# Patient Record
Sex: Male | Born: 1970 | Hispanic: No | Marital: Married | Smoking: Never smoker
Health system: Southern US, Community
[De-identification: ages and names within clinical notes are randomized; demographics above are authoritative.]

## PROBLEM LIST (undated history)

## (undated) DIAGNOSIS — E669 Obesity, unspecified: Secondary | ICD-10-CM

## (undated) DIAGNOSIS — E785 Hyperlipidemia, unspecified: Secondary | ICD-10-CM

## (undated) DIAGNOSIS — A048 Other specified bacterial intestinal infections: Secondary | ICD-10-CM

## (undated) DIAGNOSIS — R7309 Other abnormal glucose: Secondary | ICD-10-CM

## (undated) DIAGNOSIS — K219 Gastro-esophageal reflux disease without esophagitis: Secondary | ICD-10-CM

## (undated) DIAGNOSIS — K602 Anal fissure, unspecified: Secondary | ICD-10-CM

## (undated) DIAGNOSIS — I10 Essential (primary) hypertension: Secondary | ICD-10-CM

## (undated) HISTORY — PX: MENISCUS REPAIR: SHX5179

## (undated) HISTORY — DX: Other abnormal glucose: R73.09

## (undated) HISTORY — DX: Anal fissure, unspecified: K60.2

## (undated) HISTORY — DX: Gastro-esophageal reflux disease without esophagitis: K21.9

## (undated) HISTORY — DX: Obesity, unspecified: E66.9

## (undated) HISTORY — DX: Hyperlipidemia, unspecified: E78.5

## (undated) HISTORY — DX: Other specified bacterial intestinal infections: A04.8

---

## 2003-01-05 ENCOUNTER — Emergency Department (HOSPITAL_COMMUNITY): Admission: EM | Admit: 2003-01-05 | Discharge: 2003-01-05 | Payer: Self-pay | Admitting: Emergency Medicine

## 2010-07-28 ENCOUNTER — Ambulatory Visit (HOSPITAL_BASED_OUTPATIENT_CLINIC_OR_DEPARTMENT_OTHER): Admission: RE | Admit: 2010-07-28 | Discharge: 2010-07-28 | Payer: Self-pay | Admitting: Specialist

## 2011-01-29 LAB — POCT HEMOGLOBIN-HEMACUE: Hemoglobin: 13.4 g/dL (ref 13.0–17.0)

## 2012-07-20 ENCOUNTER — Emergency Department (HOSPITAL_COMMUNITY)
Admission: EM | Admit: 2012-07-20 | Discharge: 2012-07-20 | Disposition: A | Discharge status: Home / Self Care | Payer: Self-pay | Attending: Emergency Medicine | Admitting: Emergency Medicine

## 2012-07-20 ENCOUNTER — Encounter (HOSPITAL_COMMUNITY): Payer: Self-pay | Admitting: *Deleted

## 2012-07-20 ENCOUNTER — Emergency Department (HOSPITAL_COMMUNITY): Payer: Self-pay

## 2012-07-20 DIAGNOSIS — Z79899 Other long term (current) drug therapy: Secondary | ICD-10-CM | POA: Insufficient documentation

## 2012-07-20 DIAGNOSIS — R079 Chest pain, unspecified: Secondary | ICD-10-CM | POA: Insufficient documentation

## 2012-07-20 DIAGNOSIS — R42 Dizziness and giddiness: Secondary | ICD-10-CM | POA: Insufficient documentation

## 2012-07-20 DIAGNOSIS — K219 Gastro-esophageal reflux disease without esophagitis: Secondary | ICD-10-CM

## 2012-07-20 DIAGNOSIS — I10 Essential (primary) hypertension: Secondary | ICD-10-CM | POA: Insufficient documentation

## 2012-07-20 HISTORY — DX: Essential (primary) hypertension: I10

## 2012-07-20 LAB — CBC
HCT: 43.1 % (ref 39.0–52.0)
Hemoglobin: 14.3 g/dL (ref 13.0–17.0)
MCV: 81 fL (ref 78.0–100.0)
RBC: 5.32 MIL/uL (ref 4.22–5.81)
WBC: 12.4 10*3/uL — ABNORMAL HIGH (ref 4.0–10.5)

## 2012-07-20 LAB — BASIC METABOLIC PANEL
BUN: 15 mg/dL (ref 6–23)
CO2: 26 mEq/L (ref 19–32)
Chloride: 95 mEq/L — ABNORMAL LOW (ref 96–112)
Creatinine, Ser: 1.16 mg/dL (ref 0.50–1.35)
Glucose, Bld: 110 mg/dL — ABNORMAL HIGH (ref 70–99)
Potassium: 4.1 mEq/L (ref 3.5–5.1)

## 2012-07-20 MED ORDER — GI COCKTAIL ~~LOC~~
30.0000 mL | Freq: Once | ORAL | Status: AC
  Administered 2012-07-20: 30 mL via ORAL
  Filled 2012-07-20: qty 30

## 2012-07-20 MED ORDER — RANITIDINE HCL 150 MG PO CAPS: 150.0000 mg | ORAL_CAPSULE | Freq: Every day | ORAL | Status: DC

## 2012-07-20 NOTE — ED Notes (Signed)
Prescription given with discharge instructions. Pt alert and oriented x4.

## 2012-07-20 NOTE — ED Notes (Signed)
Pt. States he had recently stopped taking his BP medication (Lisinopril) and just began taking it x3 days ago. States left chest pain x3 days with dizziness and numbness in legs. Currently denies SOB, dizziness, numbness/tingling. Chest pressure 3/10.

## 2012-07-20 NOTE — ED Notes (Signed)
Patient complains of burning in chest with resolved dizziness on arrival. Here for evaluations of chest pain and BP medications

## 2012-07-20 NOTE — ED Provider Notes (Signed)
History     CSN: [id number]  Arrival date & time 07/20/12  1851   First MD Initiated Contact with Patient 07/20/12 1921      Chief Complaint  Patient presents with  . Chest Pain  . Dizziness    (Consider location/radiation/quality/duration/timing/severity/associated sxs/prior treatment) HPIMohamed A Snyder is a 41 y.o. male the history of hypertension only presents with 3 days history of chest pain. Patient says he's been out of his hypertension medicine for months, and over the course the last 3 days has had chest pain with 2 different characters. Patient says he has had some sharp stabbing pain some to the left of sternum and some underneath his left breast, and he also has a heaviness in the chest which centered over the left side. The pain does not radiate, he said he did have some occasional dizziness. The dizziness was not linked temporally with chest pain. Patient said he had symptoms were worsened after eating tonight (fried fish.) Is had no nausea or vomiting, no diaphoresis, no syncope, no fever or chills, productive cough or sick contacts.  Past Medical History  Diagnosis Date  . Hypertension     History reviewed. No pertinent past surgical history.  History reviewed. No pertinent family history.  History  Substance Use Topics  . Smoking status: Never Smoker   . Smokeless tobacco: Not on file  . Alcohol Use: No      Review of Systems positive for chest pain, dizziness; Patient denies any fevers or chills, changes in vision, earache, sore throat, neck pain or stiffness, palpitations, syncope, dyspnea, cough, wheezing, abdominal pain, nausea, vomiting, diarrhea, melena, red bloody stools, frequency, dysuria, myalgias, arthralgias, back pain, recent trauma, rash, itching, skin lesions, easy bruising or bleeding, headache, seizures, numbness, tingling or weakness and denies depression, and anxiety.    Allergies  Review of patient's allergies indicates no known  allergies.  Home Medications   Current Outpatient Rx  Name Route Sig Dispense Refill  . LISINOPRIL-HYDROCHLOROTHIAZIDE 20-25 MG PO TABS Oral Take 1 tablet by mouth daily.      BP 139/86  Pulse 90  Temp 98.4 F (36.9 C) (Oral)  SpO2 99%  Physical Exam VITAL SIGNS:   Filed Vitals:   07/20/12 1904  BP: 139/86  Pulse: 90  Temp: 98.4 F (36.9 C)   CONSTITUTIONAL: Awake, oriented, appears non-toxic HENT: Atraumatic, normocephalic, oral mucosa pink and moist, airway patent. Nares patent without drainage. External ears normal. EYES: Conjunctiva clear, EOMI, PERRLA NECK: Trachea midline, non-tender, supple CARDIOVASCULAR: Normal heart rate, Normal rhythm, No murmurs, rubs, gallops PULMONARY/CHEST: Clear to auscultation, no rhonchi, wheezes, or rales. Symmetrical breath sounds. Non-tender. ABDOMINAL: Obese, Non-distended, soft, non-tender, no rebound or guarding.  BS normal. NEUROLOGIC: Non-focal, moving all four extremities, no gross sensory or motor deficits. EXTREMITIES: No clubbing, cyanosis, or edema SKIN: Warm, Dry, No erythema, No rash  ED Course  Procedures (including critical care time)  Labs Reviewed  CBC - Abnormal; Notable for the following:    WBC 12.4 (*)     Platelets 463 (*)     All other components within normal limits  BASIC METABOLIC PANEL - Abnormal; Notable for the following:    Sodium 134 (*)     Chloride 95 (*)     Glucose, Bld 110 (*)     GFR calc non Af Amer 76 (*)     GFR calc Af Amer 88 (*)     All other components within normal limits  TROPONIN I  LAB REPORT - SCANNED   No results found.   No diagnosis found.   Medications  lisinopril-hydrochlorothiazide (PRINZIDE,ZESTORETIC) 20-25 MG per tablet (not administered)  ranitidine (ZANTAC) 150 MG capsule (not administered)  gi cocktail (Maalox,Lidocaine,Donnatal) (30 mL Oral Given 07/20/12 1955)     MDM  Ryan Snyder is a 41 y.o. male with no family history of coronary artery  disease, no personal history of coronary artery disease annually risk factor he has is hypertension.  Patient's symptoms are linked to eating - and I suspect he likely has some element of GERD.  Pt given GI cocktail with resolution of pain.  Troponin and ECG negative - with 3 days of pain, any cardiac damage would yield positive troponin.  Pain is not exertional.  Pt will be started on Zantac or OTC equivalent, can use TUMS/ Mylanta as needed.  Follow up with PCP.  I explained the diagnosis and have given explicit precautions to return to the ER including exertional chest pain,shortness of breathor any other new or worsening symptoms. The patient understands and accepts the medical plan as it's been dictated and I have answered their questions. Discharge instructions concerning home care and prescriptions have been given.  The patient is STABLE and is discharged to home in good condition.        Jones Skene, MD 07/21/12 1314

## 2012-07-20 NOTE — ED Notes (Signed)
Pt in xray

## 2012-11-16 HISTORY — PX: ESOPHAGOGASTRODUODENOSCOPY: SHX1529

## 2013-04-16 DIAGNOSIS — A048 Other specified bacterial intestinal infections: Secondary | ICD-10-CM

## 2013-04-16 HISTORY — DX: Other specified bacterial intestinal infections: A04.8

## 2013-04-25 ENCOUNTER — Emergency Department (HOSPITAL_COMMUNITY)
Admission: EM | Admit: 2013-04-25 | Discharge: 2013-04-25 | Disposition: A | Discharge status: Home / Self Care | Payer: Medicaid Other | Attending: Emergency Medicine | Admitting: Emergency Medicine

## 2013-04-25 ENCOUNTER — Encounter (HOSPITAL_COMMUNITY): Payer: Self-pay

## 2013-04-25 DIAGNOSIS — Z792 Long term (current) use of antibiotics: Secondary | ICD-10-CM | POA: Insufficient documentation

## 2013-04-25 DIAGNOSIS — G629 Polyneuropathy, unspecified: Secondary | ICD-10-CM

## 2013-04-25 DIAGNOSIS — I10 Essential (primary) hypertension: Secondary | ICD-10-CM | POA: Insufficient documentation

## 2013-04-25 DIAGNOSIS — Z79899 Other long term (current) drug therapy: Secondary | ICD-10-CM | POA: Insufficient documentation

## 2013-04-25 DIAGNOSIS — A048 Other specified bacterial intestinal infections: Secondary | ICD-10-CM | POA: Insufficient documentation

## 2013-04-25 DIAGNOSIS — H9312 Tinnitus, left ear: Secondary | ICD-10-CM

## 2013-04-25 DIAGNOSIS — H9319 Tinnitus, unspecified ear: Secondary | ICD-10-CM | POA: Insufficient documentation

## 2013-04-25 DIAGNOSIS — R509 Fever, unspecified: Secondary | ICD-10-CM | POA: Insufficient documentation

## 2013-04-25 DIAGNOSIS — G589 Mononeuropathy, unspecified: Secondary | ICD-10-CM | POA: Insufficient documentation

## 2013-04-25 DIAGNOSIS — R42 Dizziness and giddiness: Secondary | ICD-10-CM | POA: Insufficient documentation

## 2013-04-25 NOTE — ED Provider Notes (Signed)
History    This chart was scribed for non-physician practitioner Raymon Mutton PA-C working with Hurman Horn, MD by Smitty Pluck, ED scribe. This patient was seen in room WTR8/WTR8 and the patient's care was started at 3:17 PM.   CSN: [id number]  Arrival date & time 04/25/13  1412   None     Chief Complaint  Patient presents with  . Otalgia  . Tinnitus    The history is provided by the patient and medical records. No language interpreter was used.   HPI Comments: Ryan Snyder is a 42 y.o. male with hx of HTN who presents to the Emergency Department complaining of constant, moderate tinnitus in left ear onset 8 days ago. He states he was seen by PCP and had left ear irrigated with water but the ringing sensation in left ear remained. Pt states he was told to use Debrox but did not use it. He reports having pressure on scalp above ear onset 7 days ago and he has had dizziness that started 3 days ago. Pt states he is able to ambulate without complication but sometimes when he stands the dizziness occurs. Pt states he was diagnosed with H. Pylori 8 days ago by his PCP at [facility]. He reports having subjective fever 3 days ago (current temperature is 98.6). He mentions he had burning sensation in bilateral feet and hands that occurred once last week and once yesterday. He states the episodes of burning sensation lasted throughout the night (4-6 hours).  He denies loss of hearing, trouble hearing, trauma to left ear, loud noise exposure, swimming, drainage from ear, difficulty swallowing, chills, nausea, nasal congestion, vomiting, diarrhea, weakness, cough, SOB and any other pain. He states he has taken his HTN medication as instructed. He currently takes amoxicillin, biaxin and azithromycin.     Past Medical History  Diagnosis Date  . Hypertension     History reviewed. No pertinent past surgical history.  No family history on file.  History  Substance Use Topics  .  Smoking status: Never Smoker   . Smokeless tobacco: Not on file  . Alcohol Use: No      Review of Systems  Constitutional: Positive for fever. Negative for chills.  HENT: Positive for tinnitus. Negative for trouble swallowing.   Eyes: Negative for visual disturbance.  Respiratory: Negative for cough and shortness of breath.   Gastrointestinal: Negative for nausea.  Musculoskeletal: Negative for gait problem.  Neurological: Positive for dizziness. Negative for weakness, numbness and headaches.  All other systems reviewed and are negative.    Allergies  Review of patient's allergies indicates no known allergies.  Home Medications   Current Outpatient Rx  Name  Route  Sig  Dispense  Refill  . amoxicillin (AMOXIL) 500 MG tablet   Oral   Take 500 mg by mouth 2 (two) times daily.         . clarithromycin (BIAXIN) 500 MG tablet   Oral   Take 500 mg by mouth 2 (two) times daily.         Marland Kitchen lisinopril-hydrochlorothiazide (PRINZIDE,ZESTORETIC) 20-25 MG per tablet   Oral   Take 1 tablet by mouth daily.           BP 153/92  Pulse 85  Temp(Src) 98.6 F (37 C) (Oral)  Resp 17  Ht 5\' 6"  (1.676 m)  Wt 291 lb (131.997 kg)  BMI 46.99 kg/m2  SpO2 100%  Physical Exam  Nursing note and vitals reviewed. Constitutional: He is  oriented to person, place, and time. He appears well-developed and well-nourished. No distress.  HENT:  Head: Normocephalic and atraumatic.  Right Ear: Hearing, tympanic membrane, external ear and ear canal normal. No lacerations. No drainage, swelling or tenderness. No foreign bodies. No mastoid tenderness. Tympanic membrane is not injected, not scarred, not perforated, not erythematous, not retracted and not bulging. No middle ear effusion.  Left Ear: Hearing, tympanic membrane, external ear and ear canal normal. No lacerations. No drainage, swelling or tenderness. No foreign bodies. No mastoid tenderness. Tympanic membrane is not injected, not scarred,  not perforated, not erythematous, not retracted and not bulging.  No middle ear effusion.  Negative facial swelling noted Negative pain upon palpation to the maxillary and frontal sinuses   Eyes: Conjunctivae and EOM are normal. Pupils are equal, round, and reactive to light. Right eye exhibits no discharge. Left eye exhibits no discharge.  Neck: Normal range of motion. Neck supple.  Negative neck stiffness Negative masses noted Negative nuchal rigidity Negative pain upon palpation to the cervical mid-spines  Cardiovascular: Normal rate, regular rhythm and normal heart sounds.   No murmur heard. Pulses:      Radial pulses are 2+ on the right side, and 2+ on the left side.       Dorsalis pedis pulses are 2+ on the right side, and 2+ on the left side.  Pulmonary/Chest: Effort normal and breath sounds normal. No respiratory distress. He has no wheezes. He has no rales.  Neurological: He is alert and oriented to person, place, and time.  Cranial nerves III-XII grossly intact Proper gait, proper balance Negative Romberg Able to walk on tippy toes and heel-to-toe with proper balance Sensation intact to upper and lower extremities, bilaterally, with differentiation to sharp and dull touch  Skin: Skin is warm and dry. He is not diaphoretic.  Psychiatric: He has a normal mood and affect. His behavior is normal. Thought content normal.    ED Course  Procedures  DIAGNOSTIC STUDIES: Oxygen Saturation is 100% on room air, normal by my interpretation.    COORDINATION OF CARE: 3:23 PM Discussed ED treatment with pt and pt agrees.      Labs Reviewed - No data to display No results found.   1. Tinnitus, left   2. Neuropathy   3. H. pylori infection       MDM  I personally performed the services described in this documentation, which was scribed in my presence. The recorded information has been reviewed and is accurate.  Patient afebrile, stable. Reporting tinnitus that has been  ongoing for the past week - negative speech difficulty, facial drooping, stroke-like symptoms noted - negative trauma to the ear. Less likely to be due to medications, has been on blood pressure medication for many years - as per patient - and new medications were started after tinnitus onset. Negative findings on ear exam. No neurological deficits noted. Intermittent episodes of burning sensation to the upper and lower extremities bilaterally - doubt stroke since bilateral symptoms - denied injury or trauma - denied loss of sensation. Discussed case with Dr. Fonnie Jarvis - reviewed patient's presentation - discussed that imaging is not needed at this time - normal neurological exam and findings on physical exam. Referred patient to ENT and neurology - discussed with patient that further imaging may be needed when follow-up with specialists. Discussed with patient to follow-up with PCP at [facility] physicians. Discussed with patient to continue to monitor symptoms and if symptoms are to worsen or change  to report back to the ED - strict return instructions given. Patient agreed to plan of care, understood, all questions answered.    Raymon Mutton, PA-C 04/26/13 1127

## 2013-04-25 NOTE — Progress Notes (Signed)
[facility] has seen patient. Patient stated he was pending Medicaid.  Already a patient at [facility]. Gave him a list of primary care resources in case he needed it before his medicaid came through.

## 2013-04-25 NOTE — ED Notes (Signed)
Pt presents with NAD- Pt c/ of left ear pain and ear ringing since last Monday- sent by PCP which cleaned has some relief but never ended.  Here for reevaluation placed on antibiotic.

## 2013-04-26 NOTE — ED Provider Notes (Signed)
Medical screening examination/treatment/procedure(s) were performed by non-physician practitioner and as supervising physician I was immediately available for consultation/collaboration.   Hurman Horn, MD 04/26/13 2147

## 2013-06-26 ENCOUNTER — Encounter: Payer: Self-pay | Admitting: Internal Medicine

## 2013-07-31 ENCOUNTER — Encounter: Payer: Self-pay | Admitting: Internal Medicine

## 2013-07-31 ENCOUNTER — Ambulatory Visit (INDEPENDENT_AMBULATORY_CARE_PROVIDER_SITE_OTHER): Payer: Medicaid Other | Admitting: Internal Medicine

## 2013-07-31 VITALS — BP 100/70 | HR 80 | Ht 65.0 in | Wt 255.1 lb

## 2013-07-31 DIAGNOSIS — R634 Abnormal weight loss: Secondary | ICD-10-CM

## 2013-07-31 DIAGNOSIS — R1012 Left upper quadrant pain: Secondary | ICD-10-CM

## 2013-07-31 DIAGNOSIS — K602 Anal fissure, unspecified: Secondary | ICD-10-CM

## 2013-07-31 DIAGNOSIS — R1013 Epigastric pain: Secondary | ICD-10-CM

## 2013-07-31 MED ORDER — AMBULATORY NON FORMULARY MEDICATION: Status: DC

## 2013-07-31 NOTE — Progress Notes (Signed)
  Subjective:    Patient ID: Ryan Snyder, male    DOB: [birth year], 42 y.o.   MRN: [id number]  HPI The patient is a pleasant, married [place] men with several GI C/o. He has been having heartburn and indigestion. Having LUQ pain also. He has been treated for H. Pylori at [facility] clinic. No help. Now on pantoprazole and is somewhat better. He has had some hard stools and rectal pain, straining to stool,  as well as rectal bleeding at times. This is new. He is also describing early satiety and unintentional weight loss over the past several months.   No Known Allergies Outpatient Prescriptions Prior to Visit  Medication Sig Dispense Refill  . lisinopril-hydrochlorothiazide (PRINZIDE,ZESTORETIC) 20-25 MG per tablet Take 1 tablet by mouth daily.       No facility-administered medications prior to visit.   Past Medical History  Diagnosis Date  . Hypertension   . GERD (gastroesophageal reflux disease)   . Obesity   . Dyslipidemia   . Positive H. pylori test 04/2013  . Elevated hemoglobin A1c    History reviewed. No pertinent past surgical history. History   Social History  . Marital Status: Married    Spouse Name: N/A    Number of Children: N/A  . Years of Education: N/A   Social History Main Topics  . Smoking status: Never Smoker   . Smokeless tobacco: Former Neurosurgeon    Types: Snuff     Comment: from [place] country  . Alcohol Use: No  . Drug Use: No  . Sexual Activity: None   Other Topics Concern  . None   Social History Narrative   Married 1 son (2010) and 1 daughter (2012)   Gaffer in IT at [employer]   FHx. Negative for GI problems   Review of Systems Diffusely + insomnia, headaches, myalgia, fatigue, dyspnea, sore throat, itching All other ROS negative    Objective:   Physical Exam General:  Obese, well-developed, well-nourished and in no acute distress Eyes:  anicteric. ENT:   Mouth and posterior pharynx free of lesions.  Neck:   supple w/o  thyromegaly or mass.  Lungs: Clear to auscultation bilaterally. Heart:  S1S2, no rubs, murmurs, gallops. Abdomen:  obese, soft, non-tender, no hepatosplenomegaly, hernia, or mass and BS+.  Rectal: Male staff in w/ me - normal anoderm, tender and stenosis/spasm, no mass, tender posterior, slight blood w/ exam  Anoscopy:  Midline posterior anal fissure - small, moderate with sentinel pile  Lymph:  no cervical or supraclavicular adenopathy. Extremities:   no edema Skin   no rash. Neuro:  A&O x 3.  Psych:  appropriate mood and  Affect.   Data Reviewed:  labs - normal CBC and CMET, lipids 06/2013 except slightly high PLTS 514 and Hgb A1C 5.8%    Assessment & Plan:  Loss of weight  Abdominal pain, epigastric   LUQ pain -   Anal fissure  1. EGD to evaluate upper GI sxs The risks and benefits as well as alternatives of endoscopic procedure(s) have been discussed and reviewed. All questions answered. The patient agrees to proceed. 2. Educated about anal fissure, add 0.125% NTG ointment tid and Benefiber (generic ok) 3. Further plans pending results of 1 and 2

## 2013-07-31 NOTE — Patient Instructions (Signed)
You have been scheduled for an endoscopy with propofol. Please follow written instructions given to you at your visit today. If you use inhalers (even only as needed), please bring them with you on the day of your procedure. Your physician has requested that you go to www.startemmi.com and enter the access code given to you at your visit today. This web site gives a general overview about your procedure. However, you should still follow specific instructions given to you by our office regarding your preparation for the procedure.  We have sent the following medications to [facility]  for you to pick up at your convenience: Nitroglycerine, please use as directed   Fiber information given today Please purchase Benefiber over the counter and take one tablespoon twice daily

## 2013-08-02 ENCOUNTER — Encounter: Payer: Self-pay | Admitting: Internal Medicine

## 2013-08-04 ENCOUNTER — Encounter: Payer: Self-pay | Admitting: Internal Medicine

## 2013-08-04 ENCOUNTER — Ambulatory Visit (AMBULATORY_SURGERY_CENTER): Payer: Medicaid Other | Admitting: Internal Medicine

## 2013-08-04 ENCOUNTER — Telehealth: Payer: Self-pay | Admitting: Oncology

## 2013-08-04 VITALS — BP 113/72 | HR 59 | Temp 96.8°F | Resp 15 | Ht 65.0 in | Wt 255.0 lb

## 2013-08-04 DIAGNOSIS — D131 Benign neoplasm of stomach: Secondary | ICD-10-CM

## 2013-08-04 DIAGNOSIS — K2990 Gastroduodenitis, unspecified, without bleeding: Secondary | ICD-10-CM

## 2013-08-04 DIAGNOSIS — K2970 Gastritis, unspecified, without bleeding: Secondary | ICD-10-CM

## 2013-08-04 DIAGNOSIS — R634 Abnormal weight loss: Secondary | ICD-10-CM

## 2013-08-04 DIAGNOSIS — K317 Polyp of stomach and duodenum: Secondary | ICD-10-CM

## 2013-08-04 MED ORDER — PANTOPRAZOLE SODIUM 40 MG PO TBEC: 40.0000 mg | DELAYED_RELEASE_TABLET | Freq: Every day | ORAL | Status: DC

## 2013-08-04 MED ORDER — SODIUM CHLORIDE 0.9 % IV SOLN: 500.0000 mL | INTRAVENOUS | Status: DC

## 2013-08-04 NOTE — Telephone Encounter (Signed)
S/W PT AND GVE NP APPT 10/09 @ 1:30 W/DR. SHADAD REFERRING DR. Earvin Hansen HILL DX- THROMBOCYTOSIS WELCOME PACKET MAILED.

## 2013-08-04 NOTE — Telephone Encounter (Signed)
C/D 08/04/13 for appt. 08/24/13

## 2013-08-04 NOTE — Progress Notes (Signed)
Patient did not experience any of the following events: a burn prior to discharge; a fall within the facility; wrong site/side/patient/procedure/implant event; or a hospital transfer or hospital admission upon discharge from the facility. (G8907) Patient did not have preoperative order for IV antibiotic SSI prophylaxis. (G8918)  

## 2013-08-04 NOTE — Progress Notes (Signed)
Called to room to assist during endoscopic procedure.  Patient ID and intended procedure confirmed with present staff. Received instructions for my participation in the procedure from the performing physician.  

## 2013-08-04 NOTE — Patient Instructions (Addendum)
There was a small polyp or growth where the esophagus and stomach meet. i took biopsies and should know what it is by middle of next week and we will call you. It is most likely inflammation from vomiting. Everything else was ok.  I appreciate the opportunity to care for you.  Iva Boop, MD, FACG   YOU HAD AN ENDOSCOPIC PROCEDURE TODAY AT THE [facility]: Refer to the procedure report that was given to you for any specific questions about what was found during the examination.  If the procedure report does not answer your questions, please call your gastroenterologist to clarify.  If you requested that your care partner not be given the details of your procedure findings, then the procedure report has been included in a sealed envelope for you to review at your convenience later.  YOU SHOULD EXPECT: Some feelings of bloating in the abdomen. Passage of more gas than usual.  Walking can help get rid of the air that was put into your GI tract during the procedure and reduce the bloating. If you had a lower endoscopy (such as a colonoscopy or flexible sigmoidoscopy) you may notice spotting of blood in your stool or on the toilet paper. If you underwent a bowel prep for your procedure, then you may not have a normal bowel movement for a few days.  DIET: Your first meal following the procedure should be a light meal and then it is ok to progress to your normal diet.  A half-sandwich or bowl of soup is an example of a good first meal.  Heavy or fried foods are harder to digest and may make you feel nauseous or bloated.  Likewise meals heavy in dairy and vegetables can cause extra gas to form and this can also increase the bloating.  Drink plenty of fluids but you should avoid alcoholic beverages for 24 hours.  ACTIVITY: Your care partner should take you home directly after the procedure.  You should plan to take it easy, moving slowly for the rest of the day.  You can resume normal activity  the day after the procedure however you should NOT DRIVE or use heavy machinery for 24 hours (because of the sedation medicines used during the test).    SYMPTOMS TO REPORT IMMEDIATELY: A gastroenterologist can be reached at any hour.  During normal business hours, 8:30 AM to 5:00 PM Monday through Friday, call [phone].  After hours and on weekends, please call the GI answering service at [phone] who will take a message and have the physician on call contact you.     Following upper endoscopy (EGD)  Vomiting of blood or coffee ground material  New chest pain or pain under the shoulder blades  Painful or persistently difficult swallowing  New shortness of breath  Fever of 100F or higher  Black, tarry-looking stools  FOLLOW UP: If any biopsies were taken you will be contacted by phone or by letter within the next 1-3 weeks.  Call your gastroenterologist if you have not heard about the biopsies in 3 weeks.  Our staff will call the home number listed on your records the next business day following your procedure to check on you and address any questions or concerns that you may have at that time regarding the information given to you following your procedure. This is a courtesy call and so if there is no answer at the home number and we have not heard from you through the emergency  physician on call, we will assume that you have returned to your regular daily activities without incident.  SIGNATURES/CONFIDENTIALITY: You and/or your care partner have signed paperwork which will be entered into your electronic medical record.  These signatures attest to the fact that that the information above on your After Visit Summary has been reviewed and is understood.  Full responsibility of the confidentiality of this discharge information lies with you and/or your care-partner.

## 2013-08-04 NOTE — Progress Notes (Signed)
Procedure ends, to recovery, report given and VSS. 

## 2013-08-04 NOTE — Op Note (Signed)
[facility] [address] [place], [address]   ENDOSCOPY PROCEDURE REPORT  PATIENT: Ryan, Snyder  MR#: [id number] BIRTHDATE: 08-07-1971 , 41  yrs. old GENDER: Male ENDOSCOPIST: Iva Boop, MD, Clementeen Graham REFERRED BY:  Renaye Rakers, M.D. PROCEDURE DATE:  08/04/2013 PROCEDURE:  EGD w/ biopsy ASA CLASS:     Class II INDICATIONS:  Weight loss.   Epigastric pain.   Vomiting. MEDICATIONS: Propofol (Diprivan) 180 mg IV, MAC sedation, administered by CRNA, and These medications were titrated to patient response per physician's verbal order TOPICAL ANESTHETIC: Cetacaine Spray  DESCRIPTION OF PROCEDURE: After the risks benefits and alternatives of the procedure were thoroughly explained, informed consent was obtained.  The LB [id number] endoscope was introduced through the mouth and advanced to the second portion of the duodenum. Without limitations.  The instrument was slowly withdrawn as the mucosa was fully examined.        STOMACH: A sessile polyp measuring 5 mm in size was found in the cardia and at the gastroesophageal junction.  Multiple biopsies was performed using cold forceps.  Sample sent for histology.  The remainder of the upper endoscopy exam was otherwise normal. Retroflexed views revealed polyp.     The scope was then withdrawn from the patient and the procedure completed.  COMPLICATIONS: There were no complications. ENDOSCOPIC IMPRESSION: 1.   Sessile polyp measuring 5 mm in size was found in the cardia and at the gastroesophageal junction - biopsied 2.   The remainder of the upper endoscopy exam was otherwise normal  RECOMMENDATIONS: Office will call with results  REPEAT EXAM:  eSigned:  Iva Boop, MD, [facility] 08/04/2013 8:25 AM [id number] Parke Simmers, MD

## 2013-08-07 ENCOUNTER — Telehealth: Payer: Self-pay | Admitting: *Deleted

## 2013-08-07 NOTE — Telephone Encounter (Signed)
  Follow up Call-  Call back number 08/04/2013  Post procedure Call Back phone  # [phone]  Permission to leave phone message Yes     Patient questions:  Left message to call us if necessary.

## 2013-08-10 NOTE — Progress Notes (Signed)
Quick Note:  The gastric biopsy was okay, no problems. He should stay on pantoprazole and fiber and nitroglycerin ointment and see me back in followup in about 6 weeks ______

## 2013-08-18 ENCOUNTER — Other Ambulatory Visit: Payer: Self-pay | Admitting: Oncology

## 2013-08-18 DIAGNOSIS — D473 Essential (hemorrhagic) thrombocythemia: Secondary | ICD-10-CM

## 2013-08-24 ENCOUNTER — Telehealth: Payer: Self-pay | Admitting: Oncology

## 2013-08-24 ENCOUNTER — Ambulatory Visit: Payer: Medicaid Other

## 2013-08-24 ENCOUNTER — Other Ambulatory Visit (HOSPITAL_BASED_OUTPATIENT_CLINIC_OR_DEPARTMENT_OTHER): Payer: Medicaid Other | Admitting: Lab

## 2013-08-24 ENCOUNTER — Ambulatory Visit (HOSPITAL_BASED_OUTPATIENT_CLINIC_OR_DEPARTMENT_OTHER): Payer: Medicaid Other | Admitting: Oncology

## 2013-08-24 ENCOUNTER — Encounter: Payer: Self-pay | Admitting: Oncology

## 2013-08-24 VITALS — BP 145/81 | HR 80 | Temp 98.2°F | Resp 20 | Ht 65.0 in | Wt 252.2 lb

## 2013-08-24 DIAGNOSIS — D473 Essential (hemorrhagic) thrombocythemia: Secondary | ICD-10-CM

## 2013-08-24 DIAGNOSIS — Z7982 Long term (current) use of aspirin: Secondary | ICD-10-CM

## 2013-08-24 DIAGNOSIS — D75839 Thrombocytosis, unspecified: Secondary | ICD-10-CM

## 2013-08-24 LAB — COMPREHENSIVE METABOLIC PANEL (CC13)
ALT: 35 U/L (ref 0–55)
AST: 14 U/L (ref 5–34)
Alkaline Phosphatase: 112 U/L (ref 40–150)
BUN: 13.8 mg/dL (ref 7.0–26.0)
Calcium: 9.6 mg/dL (ref 8.4–10.4)
Chloride: 104 mEq/L (ref 98–109)
Creatinine: 1 mg/dL (ref 0.7–1.3)
Sodium: 138 mEq/L (ref 136–145)
Total Bilirubin: 0.61 mg/dL (ref 0.20–1.20)

## 2013-08-24 LAB — CBC WITH DIFFERENTIAL/PLATELET
BASO%: 1 % (ref 0.0–2.0)
Basophils Absolute: 0.1 10*3/uL (ref 0.0–0.1)
EOS%: 5.8 % (ref 0.0–7.0)
HCT: 39.6 % (ref 38.4–49.9)
HGB: 13.2 g/dL (ref 13.0–17.1)
LYMPH%: 28.4 % (ref 14.0–49.0)
MCH: 26.9 pg — ABNORMAL LOW (ref 27.2–33.4)
MCV: 80.9 fL (ref 79.3–98.0)
MONO%: 7.1 % (ref 0.0–14.0)
NEUT%: 57.7 % (ref 39.0–75.0)
RBC: 4.9 10*6/uL (ref 4.20–5.82)

## 2013-08-24 LAB — IRON AND TIBC CHCC
%SAT: 12 % — ABNORMAL LOW (ref 20–55)
Iron: 41 ug/dL — ABNORMAL LOW (ref 42–163)
TIBC: 340 ug/dL (ref 202–409)

## 2013-08-24 LAB — FERRITIN CHCC: Ferritin: 325 ng/ml — ABNORMAL HIGH (ref 22–316)

## 2013-08-24 NOTE — Progress Notes (Signed)
Checked in new pt with no financial concerns. °

## 2013-08-24 NOTE — Progress Notes (Signed)
Please see consult note.  

## 2013-08-24 NOTE — Telephone Encounter (Signed)
Gave pt appt for lab and Md on April 2014

## 2013-08-24 NOTE — Consult Note (Signed)
Reason for Referral: Thrombocytosis.   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 He is a gentleman with history of obesity and GERD who have had a lot of GI issues in the last few months. His symptoms constitute mostly of dyspepsia, reflux and occasional abdominal pain. He was evaluated by Dr. Leone Payor and underwent an endoscopy which was unrevealing for any malignancy. He was noted to have thrombocytosis based on a CBC done at his primary care physician in August of 2014. His platelet count was 508 with a normal white cell count and hemoglobin. His RDW is mildly elevated at 15.7 with normal chemistries. A previous CBC back on 07/20/2012 showed mild elevation in his platelet counts are 463. Patient referred to me for evaluation of thrombocytosis.  Clinically, patient is asymptomatic from this. He does report that his GI symptoms has improved with less reflux symptoms. He has lost close to 30 pounds a lot of it related to his GI symptoms although he is intentionally trying to eat better and potentially lose more weight. He used to exercise more regularly but due to knee pain he has not been able to do much of that but would like to resume exercise. He is not reporting any headaches blurred vision or double vision or neurological symptoms. He does not report any chest pain or shortness of breath or difficulty breathing. Support any heart palpitation or lower extremity edema. Stat report any cough or hemoptysis or hematemesis. Stat report any hematochezia or melena but does report some occasional bleeding per rectum from an anal fissure. Is that report any genitourinary complaints.   Past Medical History  Diagnosis Date  . Hypertension   . GERD (gastroesophageal reflux disease)   . Obesity   . Dyslipidemia   . Positive H. pylori test 04/2013  . Elevated hemoglobin A1c   :  Current Outpatient Prescriptions  Medication Sig Dispense Refill  .  AMBULATORY NON FORMULARY MEDICATION Nitroglycerine ointment 0.125 %  Apply a pea sized amount internally two to three times a day Dispense 30 GM zero refill  30 g  1  . aspirin 81 MG tablet Take 81 mg by mouth daily.      Marland Kitchen lisinopril-hydrochlorothiazide (PRINZIDE,ZESTORETIC) 20-25 MG per tablet Take 1 tablet by mouth daily.      . pantoprazole (PROTONIX) 40 MG tablet Take 1 tablet (40 mg total) by mouth daily.  30 tablet  1   No current facility-administered medications for this visit.       No Known Allergies:  No family history on file.:  History   Social History  . Marital Status: Married    Spouse Name: N/A    Number of Children: N/A  . Years of Education: N/A   Occupational History  . Not on file.   Social History Main Topics  . Smoking status: Never Smoker   . Smokeless tobacco: Current User    Types: Snuff     Comment: from his country  . Alcohol Use: No  . Drug Use: No  . Sexual Activity: Not on file   Other Topics Concern  . Not on file   Social History Narrative   Married 1 son (2010) and 1 daughter (2012)   Gaffer in IT at [employer]  :  Pertinent items are noted in HPI.  Exam:ECOG 0 Blood pressure 145/81, pulse 80, temperature 98.2 F (36.8 C), temperature source Oral, resp. rate 20, height 5'  5" (1.651 m), weight 252 lb 3.2 oz (114.397 kg). General appearance: alert, cooperative and appears stated age Head: Normocephalic, without obvious abnormality, atraumatic Throat: lips, mucosa, and tongue normal; teeth and gums normal Neck: no adenopathy, no carotid bruit, no JVD, supple, symmetrical, trachea midline and thyroid not enlarged, symmetric, no tenderness/mass/nodules Back: symmetric, no curvature. ROM normal. No CVA tenderness. Resp: clear to auscultation bilaterally Chest wall: no tenderness Cardio: regular rate and rhythm, S1, S2 normal, no murmur, click, rub or gallop GI: soft, non-tender; bowel sounds normal; no masses,  no  organomegaly Extremities: extremities normal, atraumatic, no cyanosis or edema Pulses: 2+ and symmetric Skin: Skin color, texture, turgor normal. No rashes or lesions Lymph nodes: Cervical, supraclavicular, and axillary nodes normal.   Recent Labs  08/24/13 1331  WBC 9.4  HGB 13.2  HCT 39.6  PLT 522*    Recent Labs  08/24/13 1331  NA 138  K 3.4*  CO2 26  GLUCOSE 139  BUN 13.8  CREATININE 1.0  CALCIUM 9.6     Assessment and Plan:   42 year old gentleman with thrombocytosis with a platelet count today of 522 and was at 463 in September of 2013. The differential diagnosis was discussed today with the patient and include likely secondary causes. These would include iron deficiency due to poor absorption from his GI symptoms, recent inflammatory conditions, and undiagnosed subclinical infections. The most likely scenario and him he probably has poor absorption of iron due to GI symptoms and diet that caused an elevation in his RDW and elevation in his platelet count. Primary causes such as myeloproliferative disorders are extremely unlikely. His hemoglobin and white cells are perfectly normal and I doubt he has essential thrombocythemia. Occult malignancy it is also unlikely he has very little risk factors and he had a normal chest x-ray about last year. He is up to date in the age-appropriate cancer screening as well.  The management standpoint, no further workup or recommendation is warranted at this time. I recommended he continues on low-dose aspirin for thrombosis prophylaxis and our repeat his platelet counts in 6 months. All his questions were answered today to his satisfaction.

## 2013-08-28 ENCOUNTER — Telehealth: Payer: Self-pay | Admitting: Internal Medicine

## 2013-08-28 NOTE — Telephone Encounter (Signed)
Patient c/o continued rectal pain.  I have moved up his office visit to tomorrow

## 2013-08-28 NOTE — Telephone Encounter (Signed)
Left message for patient to call back  

## 2013-08-29 ENCOUNTER — Encounter: Payer: Self-pay | Admitting: Internal Medicine

## 2013-08-29 ENCOUNTER — Ambulatory Visit (INDEPENDENT_AMBULATORY_CARE_PROVIDER_SITE_OTHER): Payer: Medicaid Other | Admitting: Internal Medicine

## 2013-08-29 VITALS — BP 112/72 | HR 76 | Ht 64.17 in | Wt 251.4 lb

## 2013-08-29 DIAGNOSIS — K6289 Other specified diseases of anus and rectum: Secondary | ICD-10-CM

## 2013-08-29 DIAGNOSIS — K602 Anal fissure, unspecified: Secondary | ICD-10-CM

## 2013-08-29 MED ORDER — LIDOCAINE (ANORECTAL) 5 % EX CREA: TOPICAL_CREAM | CUTANEOUS | Status: DC

## 2013-08-29 NOTE — Patient Instructions (Signed)
You have been scheduled for an appointment with Dr. Gaynelle Adu at [facility]. Your appointment is on 08/31/13 at 3:00. Please arrive at 2:30 for registration. Make certain to bring a list of current medications, including any over the counter medications or vitamins. Also bring your co-pay if you have one as well as your insurance cards. [facility] is located at [address]. Should you need to reschedule your appointment, please contact them at [phone].  Today we are giving you a sample and coupon of Recticare to use for rectal pain.   I appreciate the opportunity to care for you.

## 2013-08-29 NOTE — Assessment & Plan Note (Signed)
He is not responding to 20 days so far the nitroglycerin ointment. His stools are soft. He is very tender, I think surgical referral is appropriate so they can determine whether surgical therapy make more sense at this point. I've advised sitz baths. He says his stools are soft and is not straining I don't think there is anything more to do there.

## 2013-08-29 NOTE — Progress Notes (Signed)
  Subjective:    Patient ID: Ryan Snyder, male    DOB: 09/02/1971, 42 y.o.   MRN: [id number]  HPI The patient is a middle-aged sig knees man here for followup of rectal pain. I diagnosed him with an anal fissure about a month ago. He is on 0.125% nitroglycerin ointment 2-3 times a day for the last 20 days. It initially started to help in the last several days he is having terrible pain throughout the day after defecation. Very sharp posterior pain. He is still having some bright red blood per time with the first, but a day. His stools are soft is not straining. He is quite uncomfortable. His left upper quadrant pain is not a problem at this point.  Medications, allergies, past medical history, past surgical history, family history and social history are reviewed and updated in the EMR.   Review of Systems As above    Objective:   Physical Exam Obese African man in no acute distress Rectal exam w/ chaperone present demonstrates a normal anoderm. I cannot identify the fissure that I saw previously but one attempt a rectal exam he is very tender posteriorly and I feel some sort prominence in the anal canal there. I did not press further.       Assessment & Plan:   1. Rectal pain   2. Posterior anal fissure

## 2013-08-31 ENCOUNTER — Encounter (INDEPENDENT_AMBULATORY_CARE_PROVIDER_SITE_OTHER): Payer: Self-pay

## 2013-08-31 ENCOUNTER — Ambulatory Visit (INDEPENDENT_AMBULATORY_CARE_PROVIDER_SITE_OTHER): Payer: Medicaid Other | Admitting: General Surgery

## 2013-08-31 ENCOUNTER — Encounter (INDEPENDENT_AMBULATORY_CARE_PROVIDER_SITE_OTHER): Payer: Self-pay | Admitting: General Surgery

## 2013-08-31 VITALS — BP 138/84 | HR 68 | Temp 97.6°F | Resp 14 | Ht 64.0 in | Wt 249.6 lb

## 2013-08-31 DIAGNOSIS — K602 Anal fissure, unspecified: Secondary | ICD-10-CM

## 2013-08-31 MED ORDER — AMBULATORY NON FORMULARY MEDICATION: 2.0000 [drp] | Freq: Two times a day (BID) | Status: DC

## 2013-08-31 NOTE — Patient Instructions (Signed)
Stop using nitroglycerin ointment  Anal Fissure, Adult An anal fissure is a small tear or crack in the skin around the anus. Bleeding from a fissure usually stops on its own within a few minutes. However, bleeding will often reoccur with each bowel movement until the crack heals.  CAUSES   Passing large, hard stools.  Frequent diarrheal stools.  Constipation.  Inflammatory bowel disease (Crohn's disease or ulcerative colitis).  Infections.  Anal sex. SYMPTOMS   Small amounts of blood seen on your stools, on toilet paper, or in the toilet after a bowel movement.  Rectal bleeding.  Painful bowel movements.  Itching or irritation around the anus. DIAGNOSIS Your caregiver will examine the anal area. An anal fissure can usually be seen with careful inspection. A rectal exam may be performed and a short tube (anoscope) may be used to examine the anal canal. TREATMENT   You may be instructed to take fiber supplements. These supplements can soften your stool to help make bowel movements easier.  Sitz baths may be recommended to help heal the tear. Do not use soap in the sitz baths.  A medicated cream or ointment may be prescribed to lessen discomfort. HOME CARE INSTRUCTIONS   Maintain a diet high in fruits, whole grains, and vegetables. Avoid constipating foods like bananas and dairy products. Work gradually up to 25 grams of fiber per day in your diet.   Take sitz baths 3-4 times a day for 20 minutes and after each bowel movement.  Drink enough fluids to keep your urine clear or pale yellow. Drink 6-8 glasses of water per day  Only take over-the-counter or prescription medicines for pain, discomfort, or fever as directed by your caregiver. Do not take aspirin as this may increase bleeding.  Do not use ointments containing numbing medications (anesthetics) or hydrocortisone. They could slow healing. SEEK MEDICAL CARE IF:    You have a fever.  You have diarrhea mixed with  blood.  You have pain.  Your problem is getting worse rather than better. MAKE SURE YOU:   Understand these instructions.  Will watch your condition.  Will get help right away if you are not doing well or get worse. Document Released: 11/02/2005 Document Revised: 01/25/2012 Document Reviewed: 04/19/2011 [facility] Patient Information 2014 [place], [place].  WHAT IS AN ANAL FISSURE? An anal fissure (fissure-in-ano) is a small, oval shaped tear in skin that lines the opening of the anus. Fissures typically cause severe pain and bleeding with bowel movements. Fissures are quite common in the general population, but are often confused with other causes of pain and bleeding, such as hemorrhoids. WHAT ARE THE SYMPTOMS OF AN ANAL FISSURE? The typical symptoms of an anal fissure include severe pain during, and especially after, a bowel movement, lasting from several minutes to a few hours. Patients may also notice bright red blood from the anus that can be seen on the toilet paper or on the stool. Between bowel movements, patients with anal fissures are often relatively symptom-free. Many patients are fearful of having a bowel movement and may try to avoid defecation secondary to the pain.  WHAT CAUSES AN ANAL FISSURE? Fissures are usually caused by trauma to the inner lining of the anus. Patients with tight anal sphincter muscles (i.e., increased muscle tone) are more prone to developing anal fissures. A hard, dry bowel movement is typically responsible, but loose stools and diarrhea can also be the cause. Following a bowel movement, severe anal pain can produce spasm of the  anal sphincter muscle, resulting in a decrease in blood flow to the site of the injury, thus impairing healing of the wound. The next bowel movement results in more pain, anal spasm, decreased blood flow to the area, and the cycle continues. Treatments are aimed at interrupting this cycle by relaxing the anal sphincter muscle to  promote healing of the fissure.  Other, less common, causes include inflammatory conditions and certain anal infections or tumors. Anal fissures may be acute (recent onset) or chronic (present for a long period of time). Chronic fissures may be more difficult to treat, and may also have an external lump associated with the tear, called a sentinel pile or skin tag, as well as extra tissue just inside the anal canal (hypertrophied papilla) . WHAT IS THE TREATMENT OF ANAL FISSURES? The majority of anal fissures do not require surgery. The most common treatment for an acute anal fissure consists of making the stool more formed and bulky with a diet high in fiber and utilization of over-the-counter fiber supplementation (totaling 25-35 grams of fiber/day). Stool softeners and increasing water intake may be necessary to promote soft bowel movements and aid in the healing process. Topical anesthetics for pain and warm tub baths (sitz baths) for 10-20 minutes several times a day (especially after bowel movements) are soothing and promote relaxation of the anal muscles, which may help the healing process.  Other medications (such as nitroglycerin, nifedipine, or diltiazem) may be prescribed that allow relaxation of the anal sphincter muscles. Your surgeon will go over benefits and side-effects of each of these with you. Narcotic pain medications are not recommended for anal fissures, as they promote constipation. Chronic fissures are generally more difficult to treat, and your surgeon may advise surgical treatment. WILL THE PROBLEM RETURN? Fissures can recur easily, and it is quite common for a fully healed fissure to recur after a hard bowel movement or other trauma. Even when the pain and bleeding have subsided, it is very important to continue good bowel habits and a diet high in fiber as a lifestyle change. If the problem returns without an obvious cause, further assessment is warranted. WHAT CAN BE DONE IF THE  FISSURE DOES NOT HEAL? A fissure that fails to respond to conservative measures should be re-examined. Persistent hard or loose bowel movements, scarring, or spasm of the internal anal muscle all contribute to delayed healing. Other medical problems such as inflammatory bowel disease (Crohn's disease), infections, or anal tumors can cause symptoms similar to anal fissures. Patients suffering from persistent anal pain should be examined to exclude these symptoms. This may include a colonoscopy or an exam in the operating room under anesthesia. WHAT DOES SURGERY INVOLVE? Surgical options for treating anal fissure include Botulinum toxin (Botox) injection into the anal sphincter and surgical division of a portion of the internal anal sphincter (lateral internal sphincterotomy). Both of these are performed typically as outpatient, same-day procedures, or occasionally in the office setting. The goal of these surgical options is to promote relaxation of the anal sphincter, thereby decreasing anal pain and spasm, allowing the fissure to heal. Botox injection results in healing in 50-80% of patients, while sphincterotomy is reported to be over 90% successful. If a sentinel pile is present, it may be removed to promote healing of the fissure. All surgical procedures carry some risk, and a sphincterotomy can rarely interfere with one's ability to control gas and stool. Your colon and rectal surgeon will discuss these risks with you to determine the appropriate  treatment for your particular situation. HOW LONG IS THE RECOVERY AFTER SURGERY? It is important to note that complete healing with both medical and surgical treatments can take up to approximately 6-10 weeks. However, acute pain after surgery often disappears after a few days. Most patients will be able to return to work and resume daily activities in a few short days after the surgery. CAN FISSURES LEAD TO COLON CANCER? Absolutely not. Persistent symptoms,  however, need careful evaluation since other conditions other than an anal fissure can cause similar symptoms. Your colon and rectal surgeon may request additional tests, even if your fissure has successfully healed. A colonoscopy may be required to exclude other causes of rectal bleeding. WHAT IS A COLON AND RECTAL SURGEON? Colon and rectal surgeons are experts in the surgical and non-surgical treatment of diseases of the colon, rectum and anus. They have completed advanced surgical training in the treatment of these diseases as well as full general surgical training. Board-certified colon and rectal surgeons complete residencies in general surgery and colon and rectal surgery, and pass intensive examinations conducted by the American Board of Surgery and the American Board of Colon and Rectal Surgery. They are well-versed in the treatment of both benign and malignant diseases of the colon, rectum and anus and are able to perform routine screening examinations and surgically treat conditions if indicated to do so.  Author: Tyrone Apple. Pennie Banter, DO, on behalf of the [employer]   2012 American Society of Colon & Rectal Surgeons  GETTING TO GOOD BOWEL HEALTH. Irregular bowel habits such as constipation and diarrhea can lead to many problems over time.  Having one soft bowel movement a day is the most important way to prevent further problems.  The anorectal canal is designed to handle stretching and feces to safely manage our ability to get rid of solid waste (feces, poop, stool) out of our body.  BUT, hard constipated stools can act like ripping concrete bricks and diarrhea can be a burning fire to this very sensitive area of our body, causing inflamed hemorrhoids, anal fissures, increasing risk is perirectal abscesses, abdominal pain/bloating, an making irritable bowel worse.     The goal: ONE SOFT BOWEL MOVEMENT A DAY!  To have soft, regular bowel movements:    Drink at least 8 tall  glasses of water a day.     Take plenty of fiber.  Fiber is the undigested part of plant food that passes into the colon, acting s "natures broom" to encourage bowel motility and movement.  Fiber can absorb and hold large amounts of water. This results in a larger, bulkier stool, which is soft and easier to pass. Work gradually over several weeks up to 6 servings a day of fiber (25g a day even more if needed) in the form of: o Vegetables -- Root (potatoes, carrots, turnips), leafy green (lettuce, salad greens, celery, spinach), or cooked high residue (cabbage, broccoli, etc) o Fruit -- Fresh (unpeeled skin & pulp), Dried (prunes, apricots, cherries, etc ),  or stewed ( applesauce)  o Whole grain breads, pasta, etc (whole wheat)  o Bran cereals    Bulking Agents -- This type of water-retaining fiber generally is easily obtained each day by one of the following:  o Psyllium bran -- The psyllium plant is remarkable because its ground seeds can retain so much water. This product is available as Metamucil, Konsyl, Effersyllium, Per Diem Fiber, or the less expensive generic preparation in drug and health food stores.  Although labeled a laxative, it really is not a laxative.  o Methylcellulose -- This is another fiber derived from wood which also retains water. It is available as Citrucel. o Benefiber o Polyethylene Glycol - and "artificial" fiber commonly called Miralax or Glycolax.  It is helpful for people with gassy or bloated feelings with regular fiber o Flax Seed - a less gassy fiber than psyllium   No reading or other relaxing activity while on the toilet. If bowel movements take longer than 5 minutes, you are too constipated   AVOID CONSTIPATION.  High fiber and water intake usually takes care of this.  Sometimes a laxative is needed to stimulate more frequent bowel movements, but    Laxatives are not a good long-term solution as it can wear the colon out. o Osmotics (Milk of Magnesia, Fleets  phosphosoda, Magnesium citrate, MiraLax, GoLytely) are safer than  o Stimulants (Senokot, Castor Oil, Dulcolax, Ex Lax)    o Do not take laxatives for more than 7days in a row.    IF SEVERELY CONSTIPATED, try a Bowel Retraining Program: o Do not use laxatives.  o Eat a diet high in roughage, such as bran cereals and leafy vegetables.  o Drink six (6) ounces of prune or apricot juice each morning.  o Eat two (2) large servings of stewed fruit each day.  o Take one (1) heaping tablespoon of a psyllium-based bulking agent twice a day. Use sugar-free sweetener when possible to avoid excessive calories.  o Eat a normal breakfast.  o Set aside 15 minutes after breakfast to sit on the toilet, but do not strain to have a bowel movement.  o If you do not have a bowel movement by the third day, use an enema and repeat the above steps.  o

## 2013-09-04 NOTE — Progress Notes (Signed)
Patient ID: Ryan Snyder, male   DOB: 1971-09-09, 42 y.o.   MRN: [id number]  Chief Complaint  Patient presents with  . New Evaluation    eval severe rectal anal fissure    HPI Ryan Snyder is a 42 y.o. male.   HPI 42 year old male referred by Dr. Stan Head evaluation of an anal fissure. He reports that he has had 3-4 months of pain with defecation. He describes it as a burning sensation. He also describes it as a sharp sensation. The pain is sometimes associated air that it makes him cry. He was having 2 bowel movements a day. Now he is limiting himself to one bowel movement. He states that his stools are not hard. He was treated for H. Pylori. He states that after he changed his diet because of H. Pylori he started having pain with defecation. He states that his drinking 5 glasses of water. He only started Benefiber about 2 days ago. He denies any bleeding. He denies sitting on the toilet for prolonged period of time. He denies any straining with defecation. He is occasionally doing a sitz bath. He states that he was applying the nitroglycerin ointment 3 times a day. He is also applying lidocaine ointment occasionally however that causes burning with application. Past Medical History  Diagnosis Date  . Hypertension   . GERD (gastroesophageal reflux disease)   . Obesity   . Dyslipidemia   . Positive H. pylori test 04/2013  . Elevated hemoglobin A1c     Past Surgical History  Procedure Laterality Date  . Meniscus repair      left knee    History reviewed. No pertinent family history.  Social History History  Substance Use Topics  . Smoking status: Never Smoker   . Smokeless tobacco: Former Neurosurgeon    Types: Snuff    Quit date: 07/30/2013     Comment: from [place]  . Alcohol Use: No    No Known Allergies  Current Outpatient Prescriptions  Medication Sig Dispense Refill  . aspirin 81 MG tablet Take 81 mg by mouth daily.      . Lidocaine, Anorectal, (RECTICARE) 5  % CREA Use as directed.  1 Tube  0  . lisinopril-hydrochlorothiazide (PRINZIDE,ZESTORETIC) 20-25 MG per tablet Take 1 tablet by mouth daily.      . pantoprazole (PROTONIX) 40 MG tablet Take 1 tablet (40 mg total) by mouth daily.  30 tablet  1  . AMBULATORY NON FORMULARY MEDICATION Apply 2 drops topically 2 (two) times daily. Medication Name: nifedipine 2% ointment Apply liberal amount around anus twice a day for 6 weeks  30 g  1   No current facility-administered medications for this visit.    Review of Systems Review of Systems  Constitutional: Negative for fever, chills, appetite change and unexpected weight change.  HENT: Negative for congestion and trouble swallowing.   Eyes: Negative for visual disturbance.  Respiratory: Negative for chest tightness and shortness of breath.   Cardiovascular: Negative for chest pain and leg swelling.       No PND, no orthopnea, no DOE  Gastrointestinal:       See HPI  Genitourinary: Negative for dysuria and hematuria.  Musculoskeletal: Negative.   Skin: Negative for rash.  Neurological: Negative for seizures and speech difficulty.  Hematological: Does not bruise/bleed easily.  Psychiatric/Behavioral: Negative for behavioral problems and confusion.    Blood pressure 138/84, pulse 68, temperature 97.6 F (36.4 C), temperature source Temporal, resp. rate 14, height  5\' 4"  (1.626 m), weight 249 lb 9.6 oz (113.218 kg).  Physical Exam Physical Exam  Vitals reviewed. Constitutional: He is oriented to person, place, and time. He appears well-developed and well-nourished. No distress.  obese  HENT:  Head: Normocephalic and atraumatic.  Right Ear: External ear normal.  Left Ear: External ear normal.  Eyes: Conjunctivae are normal. No scleral icterus.  Neck: Normal range of motion. Neck supple. No tracheal deviation present. No thyromegaly present.  Cardiovascular: Normal rate, normal heart sounds and intact distal pulses.   Pulmonary/Chest: Effort  normal and breath sounds normal. No respiratory distress. He has no wheezes.  Abdominal: Soft. He exhibits no distension. There is no tenderness. There is no rebound.  Genitourinary: Rectal exam shows fissure.  Visual inspection only - posterior midline fissure. DRE and anoscopy secondary to presence of fissure  Musculoskeletal: Normal range of motion. He exhibits no edema and no tenderness.  Lymphadenopathy:    He has no cervical adenopathy.  Neurological: He is alert and oriented to person, place, and time. He exhibits normal muscle tone.  Skin: Skin is warm and dry. No rash noted. He is not diaphoretic. No erythema. No pallor.  Psychiatric: He has a normal mood and affect. His behavior is normal. Judgment and thought content normal.    Data Reviewed Dr Marvell Fuller notes Upped endo Dr Alver Fisher note  Assessment    Posterior anal fissure     Plan    We discussed the etiology of anal fissures. The patient was given educational material as well as diagrams. We discussed nonoperative and operative management of anal fissures.  We discussed nonoperative management including correcting underlying bowel habits such as constipation, avoiding bathroom reading, avoiding straining with defecation. We also discussed the use of topical ointments such as nifedipine ointment. We also discussed the use of Botox injection.  With respect to surgical intervention, we discussed an anal sphincterotomy. I described how the procedure is performed. We also discussed the aftercare. We discussed the risk and benefits of surgery including but not limited to bleeding, infection, blood clot formation, general anesthesia risk, urinary retention, and the risk of incontinence. We discussed a 20-25% chance of incontinence to flatus, a 10-20% chance of incontinence to liquid stool, and a 5-10% chance of incontinence to solid stool. I explained that the percentages I quoted are from the literature and not from my personal  practice experience.  My recommendation was to start with non-operative management first.  The patient has elected To continue with nonoperative management for now. He would like to try to heal the fissure without surgery. We discussed the importance of sitz baths. I discussed the importance of fiber and fiber supplementation. I discussed the importance of him trying not limit his stools. We will switch him to nifedipine 2% topical ointment twice a day for 6 weeks. Followup in 6 weeks. If not resolved patient would then like to proceed with sphincterotomy  Mary Sella. Andrey Campanile, MD, FACS General, Bariatric, & Minimally Invasive Surgery [facility], [place]          [facility] M 09/04/2013, 2:45 PM

## 2013-09-08 ENCOUNTER — Ambulatory Visit: Payer: Medicaid Other | Admitting: Internal Medicine

## 2013-10-19 ENCOUNTER — Encounter (INDEPENDENT_AMBULATORY_CARE_PROVIDER_SITE_OTHER): Payer: Self-pay

## 2013-10-19 ENCOUNTER — Encounter (INDEPENDENT_AMBULATORY_CARE_PROVIDER_SITE_OTHER): Payer: Self-pay | Admitting: General Surgery

## 2013-10-19 ENCOUNTER — Ambulatory Visit (INDEPENDENT_AMBULATORY_CARE_PROVIDER_SITE_OTHER): Payer: Medicaid Other | Admitting: General Surgery

## 2013-10-19 VITALS — BP 112/80 | HR 88 | Resp 20 | Ht 64.0 in | Wt 242.0 lb

## 2013-10-19 DIAGNOSIS — K602 Anal fissure, unspecified: Secondary | ICD-10-CM

## 2013-10-19 NOTE — Patient Instructions (Addendum)
Continue to apply the ointment (nifedipine) twice a day Eat a high fiber diet as discussed - aim for 25 grams a day - continue benefiber as needed Call office if you need a refill on ointment

## 2013-10-19 NOTE — Progress Notes (Signed)
Subjective:     Patient ID: Ryan Snyder, male   DOB: 04/18/1971, 42 y.o.   MRN: [id number]  HPI This 42 year old gentleman comes in for followup regarding his anal fissure. I initially met him on October 16. At that time we instituted and continued non-operative management. He was placed on nifedipine ointment as well as a high fiber diet. He states that he is doing great. He no longer has any pain with defecation. He is now able to sit for prolonged periods of time without getting rectal or anal discomfort. He denies any melena or hematochezia. He states that he still has a blister in the area. He states that area will occasionally itch. He reports having daily bowel movements. He is using the ointment twice a day. He is also taking supplemental Benefiber.  PMHx, PSHx, SOCHx, FAMHx, ALL reviewed and unchanged  Review of Systems 6 point review of systems was performed and all are negative except for what is mentioned in the history of present illness    Objective:   Physical Exam BP 112/80  Pulse 88  Resp 20  Ht 5\' 4"  (1.626 m)  Wt 242 lb (109.77 kg)  BMI 41.52 kg/m2 Alert, nad Morbidly obese No rash No edema Rectal - visual inspection - shallow posterior anal fissure - smaller. DRE/anoscopy deferred due to presence of fissure    Assessment:     Healing posterior anal fissure     Plan:     Overall he is slowly healing. Symptomatically he is completely resolved. However as I explained to him that the blister that he notices is in fact a persistent small fissure.  Since he is symptomatically improved I am not sure if he would benefit much from surgery at this point. I recommended ongoing use of the nifedipine twice a day as well as continued high fiber diet with fiber supplementation as needed to try to get to 25 g a day of fiber. Counseling him back in 44 weeks for repeat exam. If he still has a residual fissure we will entertain Botox injections at that point.  Mary Sella. Andrey Campanile, MD,  FACS General, Bariatric, & Minimally Invasive Surgery [facility], [place]

## 2013-10-30 ENCOUNTER — Other Ambulatory Visit (INDEPENDENT_AMBULATORY_CARE_PROVIDER_SITE_OTHER): Payer: Self-pay | Admitting: Otolaryngology

## 2013-10-30 ENCOUNTER — Ambulatory Visit
Admission: RE | Admit: 2013-10-30 | Discharge: 2013-10-30 | Disposition: A | Discharge status: Home / Self Care | Payer: Medicaid Other | Source: Ambulatory Visit | Attending: Otolaryngology | Admitting: Otolaryngology

## 2013-10-30 DIAGNOSIS — H9319 Tinnitus, unspecified ear: Secondary | ICD-10-CM

## 2013-10-30 MED ORDER — GADOBENATE DIMEGLUMINE 529 MG/ML IV SOLN
20.0000 mL | Freq: Once | INTRAVENOUS | Status: AC | PRN
  Administered 2013-10-30: 20 mL via INTRAVENOUS

## 2013-12-14 ENCOUNTER — Encounter (INDEPENDENT_AMBULATORY_CARE_PROVIDER_SITE_OTHER): Payer: Self-pay

## 2013-12-14 ENCOUNTER — Encounter (INDEPENDENT_AMBULATORY_CARE_PROVIDER_SITE_OTHER): Payer: Self-pay | Admitting: General Surgery

## 2013-12-14 ENCOUNTER — Ambulatory Visit (INDEPENDENT_AMBULATORY_CARE_PROVIDER_SITE_OTHER): Payer: Medicaid Other | Admitting: General Surgery

## 2013-12-14 VITALS — BP 116/80 | HR 76 | Temp 97.7°F | Resp 14 | Ht 64.0 in | Wt 241.2 lb

## 2013-12-14 DIAGNOSIS — K602 Anal fissure, unspecified: Secondary | ICD-10-CM

## 2013-12-14 NOTE — Patient Instructions (Signed)
Can stop using nifedipine ointment Continue practicing the bowel habits that we have been discussing - drink plenty of water, eat a high fiber diet Call if symptoms return

## 2013-12-14 NOTE — Progress Notes (Signed)
Subjective:     Patient ID: Ryan Snyder, male   DOB: 07-07-1971, 43 y.o.   MRN: [id number]  HPI 43 year old male comes in for long-term followup regarding a posterior midline anal fissure. He has been doing medical treatment. I last saw him in the office on December 4 at which time he was essentially asymptomatic. He denies any new changes. He he completed a graduate degree in Librarian, academic. He denies any pain with defecation. He denies any melena or hematochezia. He denies any bleeding with stools. He has daily bowel movements. He is drinking plenty of water. He is eating a high fiber diet. He is still applying nifedipine twice a day. He denies any perianal itching or burning PMHx, PSHx, SOCHx, FAMHx, ALL reviewed and unchanged  Review of Systems 10 point ROS performed and negative except for HPI    Objective:   Physical Exam BP 116/80  Pulse 76  Temp(Src) 97.7 F (36.5 C) (Oral)  Resp 14  Ht 5\' 4"  (1.626 m)  Wt 241 lb 3.2 oz (109.408 kg)  BMI 41.38 kg/m2  Gen: alert, NAD, non-toxic appearing Pupils: equal, no scleral icterus Pulm: Lungs clear to auscultation, symmetric chest rise CV: regular rate and rhythm Abd: soft, nontender, nondistended.  Rectal: visual inspection - positive posterior midline anal fissure, small, with sentinel tag. DRE deferred Skin: no rash, no jaundice     Assessment:     Chronic anal fissure     Plan:     He does have a chronic anal fissure with a sentinel tag. However he is completely asymptomatic at this point. I'm not sure he would get much benefit from a lateral anal sphincterotomy. We did discuss the risk and benefits of surgery. At this point since he is asymptomatic he has agreed to continue with medical treatment. I did tell him he could stop using the nifedipine ointment. I believe is that as long as he continues to have regular bowel movements and eats a high fiber diet the likelihood of a recurrent flare is low.   F/u  prn  Leighton Ruff. Redmond Pulling, MD, FACS General, Bariatric, & Minimally Invasive Surgery [facility], [place]

## 2014-02-22 ENCOUNTER — Other Ambulatory Visit (HOSPITAL_BASED_OUTPATIENT_CLINIC_OR_DEPARTMENT_OTHER): Payer: Medicaid Other

## 2014-02-22 ENCOUNTER — Ambulatory Visit (HOSPITAL_BASED_OUTPATIENT_CLINIC_OR_DEPARTMENT_OTHER): Payer: Medicaid Other | Admitting: Oncology

## 2014-02-22 ENCOUNTER — Encounter: Payer: Self-pay | Admitting: Oncology

## 2014-02-22 VITALS — BP 138/78 | HR 80 | Temp 98.4°F | Resp 20 | Ht 64.0 in | Wt 232.3 lb

## 2014-02-22 DIAGNOSIS — D473 Essential (hemorrhagic) thrombocythemia: Secondary | ICD-10-CM

## 2014-02-22 DIAGNOSIS — D75839 Thrombocytosis, unspecified: Secondary | ICD-10-CM

## 2014-02-22 LAB — COMPREHENSIVE METABOLIC PANEL (CC13)
ALT: 16 U/L (ref 0–55)
ANION GAP: 9 meq/L (ref 3–11)
AST: 14 U/L (ref 5–34)
Albumin: 3.5 g/dL (ref 3.5–5.0)
Alkaline Phosphatase: 105 U/L (ref 40–150)
BILIRUBIN TOTAL: 0.76 mg/dL (ref 0.20–1.20)
BUN: 13 mg/dL (ref 7.0–26.0)
CHLORIDE: 107 meq/L (ref 98–109)
CO2: 26 mEq/L (ref 22–29)
CREATININE: 1.1 mg/dL (ref 0.7–1.3)
Calcium: 9.5 mg/dL (ref 8.4–10.4)
Glucose: 103 mg/dl (ref 70–140)
Potassium: 3.6 mEq/L (ref 3.5–5.1)
Sodium: 142 mEq/L (ref 136–145)
Total Protein: 7.1 g/dL (ref 6.4–8.3)

## 2014-02-22 LAB — CBC WITH DIFFERENTIAL/PLATELET
BASO%: 0.7 % (ref 0.0–2.0)
BASOS ABS: 0.1 10*3/uL (ref 0.0–0.1)
EOS%: 3.8 % (ref 0.0–7.0)
Eosinophils Absolute: 0.3 10*3/uL (ref 0.0–0.5)
HCT: 40.2 % (ref 38.4–49.9)
HEMOGLOBIN: 13.2 g/dL (ref 13.0–17.1)
LYMPH%: 37 % (ref 14.0–49.0)
MCH: 26.8 pg — ABNORMAL LOW (ref 27.2–33.4)
MCHC: 32.9 g/dL (ref 32.0–36.0)
MCV: 81.4 fL (ref 79.3–98.0)
MONO#: 0.6 10*3/uL (ref 0.1–0.9)
MONO%: 7.3 % (ref 0.0–14.0)
NEUT%: 51.2 % (ref 39.0–75.0)
NEUTROS ABS: 3.9 10*3/uL (ref 1.5–6.5)
Platelets: 485 10*3/uL — ABNORMAL HIGH (ref 140–400)
RBC: 4.94 10*6/uL (ref 4.20–5.82)
RDW: 15.5 % — ABNORMAL HIGH (ref 11.0–14.6)
WBC: 7.6 10*3/uL (ref 4.0–10.3)
lymph#: 2.8 10*3/uL (ref 0.9–3.3)

## 2014-02-22 NOTE — Progress Notes (Signed)
Hematology and Oncology Follow Up Visit  Carr Shartzer [id number] 1971-10-22 43 y.o. 02/22/2014 1:50 PM BLAND,VEITA J, MDNo ref. 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 Diagnosed in October of 2014.   Interim History:  This is a pleasant gentleman presents today for a followup visit. I saw him an evaluation back in October of 2014 after he presented with thrombocytosis with a platelet count of 522. His iron studies show an iron level of 41 and saturation of 12. He has had some problems with gastric reflux disease and he is currently on Protonix. Since the last visit he had not had any complaints. He is doing better from his reflux standpoint. He has not reported any bleeding or thrombosis episode. Has not reported any GI or GU bleeding. Has not reported any decline or his energy her performance status.  Medications: I have reviewed the patient's current medications.  Current Outpatient Prescriptions  Medication Sig Dispense Refill  . lisinopril-hydrochlorothiazide (PRINZIDE,ZESTORETIC) 20-25 MG per tablet Take 1 tablet by mouth daily.      . pantoprazole (PROTONIX) 40 MG tablet Take 1 tablet (40 mg total) by mouth daily.  30 tablet  1   No current facility-administered medications for this visit.     Allergies: No Known Allergies  Past Medical History, Surgical history, Social history, and Family History were reviewed and updated.  Review of Systems: Constitutional:  Negative for fever, chills, night sweats, anorexia, weight loss, pain. Cardiovascular: no chest pain or dyspnea on exertion Respiratory: no cough, shortness of breath, or wheezing Neurological: no TIA or stroke symptoms Dermatological: negative ENT: negative Skin: Negative. Gastrointestinal: no abdominal pain, change in bowel habits, or black or bloody stools Genito-Urinary: no dysuria, trouble voiding, or hematuria Hematological and  Lymphatic: negative Breast: negative for breast lumps Musculoskeletal: negative Remaining ROS negative. Physical Exam: Blood pressure 138/78, pulse 80, temperature 98.4 F (36.9 C), temperature source Oral, resp. rate 20, height 5\' 4"  (1.626 m), weight 232 lb 4.8 oz (105.371 kg), SpO2 100.00%. ECOG: 0 General appearance: alert, cooperative and appears stated age Head: Normocephalic, without obvious abnormality, atraumatic Neck: no adenopathy, no carotid bruit, no JVD, supple, symmetrical, trachea midline and thyroid not enlarged, symmetric, no tenderness/mass/nodules Lymph nodes: Cervical, supraclavicular, and axillary nodes normal. Heart:regular rate and rhythm, S1, S2 normal, no murmur, click, rub or gallop Lung:chest clear, no wheezing, rales, normal symmetric air entry Abdomin: soft, non-tender, without masses or organomegaly EXT:no erythema, induration, or nodules   Lab Results: Lab Results  Component Value Date   WBC 7.6 02/22/2014   HGB 13.2 02/22/2014   HCT 40.2 02/22/2014   MCV 81.4 02/22/2014   PLT 485* 02/22/2014     Chemistry      Component Value Date/Time   NA 142 02/22/2014 1303   NA 134* 07/20/2012 1937   K 3.6 02/22/2014 1303   K 4.1 07/20/2012 1937   CL 95* 07/20/2012 1937   CO2 26 02/22/2014 1303   CO2 26 07/20/2012 1937   BUN 13.0 02/22/2014 1303   BUN 15 07/20/2012 1937   CREATININE 1.1 02/22/2014 1303   CREATININE 1.16 07/20/2012 1937      Component Value Date/Time   CALCIUM 9.5 02/22/2014 1303   CALCIUM 10.5 07/20/2012 1937   ALKPHOS 105 02/22/2014 1303   AST 14 02/22/2014 1303   ALT 16 02/22/2014 1303   BILITOT 0.76 02/22/2014 1303         Impression  and Plan:  43 year old gentleman with mild thrombocytosis 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 His platelet count today is actually lower and appears to be close to baseline. I see no evidence to suggest a myeloproliferative disorder this is related mostly to mild iron deficiency. We'll discuss ways to improve his iron intake including  supplementation as well as eating iron rich diet. I also emphasized the importance of colorectal cancer screening as he follows with gastroenterology in the future. All his questions are answered all follow with me as needed.  Wyatt Portela, MD 4/9/20151:50 PM

## 2014-07-09 ENCOUNTER — Telehealth: Payer: Self-pay | Admitting: Internal Medicine

## 2014-07-09 ENCOUNTER — Encounter: Payer: Self-pay | Admitting: Gastroenterology

## 2014-07-09 NOTE — Telephone Encounter (Signed)
Patient will come in and see Dr. Carlean Purl tomorrow at 1:30.  He is agreeable to this

## 2014-07-10 ENCOUNTER — Ambulatory Visit (INDEPENDENT_AMBULATORY_CARE_PROVIDER_SITE_OTHER): Payer: Medicaid Other | Admitting: Internal Medicine

## 2014-07-10 ENCOUNTER — Encounter: Payer: Self-pay | Admitting: Internal Medicine

## 2014-07-10 VITALS — BP 100/64 | HR 76 | Ht 64.0 in | Wt 221.0 lb

## 2014-07-10 DIAGNOSIS — K219 Gastro-esophageal reflux disease without esophagitis: Secondary | ICD-10-CM

## 2014-07-10 DIAGNOSIS — K602 Anal fissure, unspecified: Secondary | ICD-10-CM

## 2014-07-10 MED ORDER — PANTOPRAZOLE SODIUM 40 MG PO TBEC: 40.0000 mg | DELAYED_RELEASE_TABLET | Freq: Every day | ORAL | Status: DC

## 2014-07-10 NOTE — Progress Notes (Signed)
   Subjective:    Patient ID: Ryan Snyder, male    DOB: 22-Dec-1970, 43 y.o.   MRN: [id number]  HPI Middle-aged [place] man here with new c/o. Having intermittent warm sensations in chest, LUW abnd back. Usually at night. Works 5 or 6-230 PM. Eats last meal at 830 PM then has cereal before bedtime. Taking PPI at  Bedtime Only 1 cup of tea daily. Has graduated - trying to get an IT job. Continues to work at [employer].  Wife getting master's in Engineer, production  Anal fissure is much better - minimally symptomatic.  Medications, allergies, past medical history, past surgical history, family history and social history are reviewed and updated in the EMR.  Review of Systems Wt Readings from Last 3 Encounters:  07/10/14 221 lb (100.245 kg)  02/22/14 232 lb 4.8 oz (105.371 kg)  12/14/13 241 lb 3.2 oz (109.408 kg)       Objective:   Physical Exam General:  NAD Eyes:   anicteric Lungs:  clear Heart:  S1S2 no rubs, murmurs or gallops Abdomen:  soft and nontender, BS+ Ext:   no edema    Assessment & Plan:  Posterior anal fissure OK now after medical Tx w/ NTG and then verapamil - minimal sxs  GERD (gastroesophageal reflux disease) Avoid cereal and milk before bed Take PPI before supper Review labs he has had from PCP [facility]) RTC October    Cc: Maggie Font, MD

## 2014-07-10 NOTE — Patient Instructions (Signed)
We have sent the following medications to your pharmacy for you to pick up at your convenience: Pantoprazole  Take your pantoprazole  30 minutes before supper daily.  Today you have been given a GERD handout to read and follow.  Wait at least 3 hours before lying down after eating.  We are going to obtain your labs done in 2015 by Dr. Margie Billet office for Dr. Carlean Purl to review.   I appreciate the opportunity to care for you.

## 2014-07-10 NOTE — Assessment & Plan Note (Signed)
OK now after medical Tx w/ NTG and then verapamil - minimal sxs

## 2014-07-10 NOTE — Assessment & Plan Note (Signed)
Avoid cereal and milk before bed Take PPI before supper Review labs he has had from PCP [facility]) RTC October

## 2015-08-01 ENCOUNTER — Other Ambulatory Visit: Payer: Self-pay | Admitting: Internal Medicine

## 2016-06-08 ENCOUNTER — Other Ambulatory Visit: Payer: Self-pay | Admitting: Internal Medicine

## 2016-08-06 ENCOUNTER — Other Ambulatory Visit: Payer: Self-pay | Admitting: Internal Medicine

## 2016-08-10 ENCOUNTER — Telehealth: Payer: Self-pay | Admitting: Internal Medicine

## 2016-08-10 MED ORDER — PANTOPRAZOLE SODIUM 40 MG PO TBEC: DELAYED_RELEASE_TABLET | ORAL | 1 refills | Status: DC

## 2016-08-10 NOTE — Telephone Encounter (Signed)
rx sent in as requested, appt made for Nov.

## 2016-10-13 ENCOUNTER — Encounter: Payer: Self-pay | Admitting: Internal Medicine

## 2016-10-13 ENCOUNTER — Ambulatory Visit (INDEPENDENT_AMBULATORY_CARE_PROVIDER_SITE_OTHER): Payer: Medicaid Other | Admitting: Internal Medicine

## 2016-10-13 VITALS — BP 124/74 | HR 67 | Ht 64.0 in | Wt 240.0 lb

## 2016-10-13 DIAGNOSIS — K219 Gastro-esophageal reflux disease without esophagitis: Secondary | ICD-10-CM | POA: Diagnosis not present

## 2016-10-13 MED ORDER — PANTOPRAZOLE SODIUM 40 MG PO TBEC: DELAYED_RELEASE_TABLET | ORAL | 11 refills | Status: DC

## 2016-10-13 NOTE — Progress Notes (Signed)
   Ryan Snyder 45 y.o. March 15, 1971 [id number]  Assessment & Plan:   1. Gastroesophageal reflux disease, esophagitis presence not specified     He is doing well at this time. I've refilled his PPI and suggested that he get further refills through his primary care provider.  Subjective:   Chief Complaint: Follow-up of reflux  HPI  The patient is here for follow-up he has no complaints of reflux symptoms while taking PPI. He was last seen in August 2015. He would like refills on his pantoprazole. Medications, allergies, past medical history, past surgical history, family history and social history are reviewed and updated in the EMR.  Review of Systems As above  Objective:   Physical Exam BP 124/74 (BP Location: Right Arm, Patient Position: Sitting, Cuff Size: Large)   Pulse 67   Ht 5\' 4"  (1.626 m)   Wt 240 lb (108.9 kg)   SpO2 98%   BMI 41.20 kg/m  Obese no acute distress

## 2016-10-13 NOTE — Patient Instructions (Signed)
   Glad things are ok. Prescription was sent to pharmacy for pantoprazole. Your primary care provider should be able to fill this in future.  Gatha Mayer, MD, [facility]

## 2017-04-09 ENCOUNTER — Other Ambulatory Visit: Payer: Self-pay | Admitting: Family Medicine

## 2017-04-09 DIAGNOSIS — R109 Unspecified abdominal pain: Secondary | ICD-10-CM

## 2017-04-15 ENCOUNTER — Ambulatory Visit
Admission: RE | Admit: 2017-04-15 | Discharge: 2017-04-15 | Disposition: A | Discharge status: Home / Self Care | Payer: Medicaid Other | Source: Ambulatory Visit | Attending: Family Medicine | Admitting: Family Medicine

## 2017-04-15 DIAGNOSIS — R109 Unspecified abdominal pain: Secondary | ICD-10-CM

## 2017-07-29 ENCOUNTER — Encounter (HOSPITAL_COMMUNITY): Payer: Self-pay | Admitting: Emergency Medicine

## 2017-07-29 ENCOUNTER — Observation Stay (HOSPITAL_COMMUNITY)
Admission: EM | Admit: 2017-07-29 | Discharge: 2017-07-30 | Disposition: A | Discharge status: Home / Self Care | Payer: Self-pay | Attending: Internal Medicine | Admitting: Internal Medicine

## 2017-07-29 ENCOUNTER — Emergency Department (HOSPITAL_COMMUNITY): Payer: Self-pay

## 2017-07-29 DIAGNOSIS — I10 Essential (primary) hypertension: Secondary | ICD-10-CM | POA: Diagnosis present

## 2017-07-29 DIAGNOSIS — D649 Anemia, unspecified: Secondary | ICD-10-CM | POA: Insufficient documentation

## 2017-07-29 DIAGNOSIS — R079 Chest pain, unspecified: Principal | ICD-10-CM | POA: Diagnosis present

## 2017-07-29 DIAGNOSIS — Z79899 Other long term (current) drug therapy: Secondary | ICD-10-CM | POA: Insufficient documentation

## 2017-07-29 DIAGNOSIS — Z6838 Body mass index (BMI) 38.0-38.9, adult: Secondary | ICD-10-CM | POA: Insufficient documentation

## 2017-07-29 DIAGNOSIS — R5383 Other fatigue: Secondary | ICD-10-CM | POA: Insufficient documentation

## 2017-07-29 DIAGNOSIS — R110 Nausea: Secondary | ICD-10-CM | POA: Insufficient documentation

## 2017-07-29 DIAGNOSIS — Z87891 Personal history of nicotine dependence: Secondary | ICD-10-CM | POA: Insufficient documentation

## 2017-07-29 DIAGNOSIS — K219 Gastro-esophageal reflux disease without esophagitis: Secondary | ICD-10-CM

## 2017-07-29 LAB — COMPREHENSIVE METABOLIC PANEL WITH GFR
ALT: 17 U/L (ref 17–63)
AST: 18 U/L (ref 15–41)
Albumin: 4 g/dL (ref 3.5–5.0)
Alkaline Phosphatase: 84 U/L (ref 38–126)
Anion gap: 8 (ref 5–15)
BUN: 14 mg/dL (ref 6–20)
CO2: 29 mmol/L (ref 22–32)
Calcium: 9.6 mg/dL (ref 8.9–10.3)
Chloride: 100 mmol/L — ABNORMAL LOW (ref 101–111)
Creatinine, Ser: 1.06 mg/dL (ref 0.61–1.24)
GFR calc Af Amer: >60 mL/min
GFR calc non Af Amer: >60 mL/min
Glucose, Bld: 130 mg/dL — ABNORMAL HIGH (ref 65–99)
Potassium: 4.3 mmol/L (ref 3.5–5.1)
Sodium: 137 mmol/L (ref 135–145)
Total Bilirubin: 0.7 mg/dL (ref 0.3–1.2)
Total Protein: 7.7 g/dL (ref 6.5–8.1)

## 2017-07-29 LAB — CBC
HCT: 41.3 % (ref 39.0–52.0)
HEMOGLOBIN: 13.9 g/dL (ref 13.0–17.0)
MCH: 28.1 pg (ref 26.0–34.0)
MCHC: 33.7 g/dL (ref 30.0–36.0)
MCV: 83.6 fL (ref 78.0–100.0)
Platelets: 384 10*3/uL (ref 150–400)
RBC: 4.94 MIL/uL (ref 4.22–5.81)
RDW: 13.7 % (ref 11.5–15.5)
WBC: 7.6 10*3/uL (ref 4.0–10.5)

## 2017-07-29 LAB — URINALYSIS, ROUTINE W REFLEX MICROSCOPIC
Bacteria, UA: NONE SEEN
Bilirubin Urine: NEGATIVE
Glucose, UA: NEGATIVE mg/dL
Ketones, ur: NEGATIVE mg/dL
Leukocytes, UA: NEGATIVE
Nitrite: NEGATIVE
Protein, ur: NEGATIVE mg/dL
Specific Gravity, Urine: 1.006 (ref 1.005–1.030)
Squamous Epithelial / HPF: NONE SEEN
pH: 5 (ref 5.0–8.0)

## 2017-07-29 LAB — TROPONIN I: Troponin I: <0.03 ng/mL (ref <0.03)

## 2017-07-29 LAB — D-DIMER, QUANTITATIVE: D-Dimer, Quant: <0.27 ug{FEU}/mL (ref 0.00–0.50)

## 2017-07-29 LAB — LIPASE, BLOOD: Lipase: 18 U/L (ref 11–51)

## 2017-07-29 MED ORDER — LABETALOL HCL 5 MG/ML IV SOLN
10.0000 mg | INTRAVENOUS | Status: DC | PRN
  Filled 2017-07-29: qty 4

## 2017-07-29 MED ORDER — ONDANSETRON HCL 4 MG/2ML IJ SOLN: 4.0000 mg | Freq: Four times a day (QID) | INTRAMUSCULAR | Status: DC | PRN

## 2017-07-29 MED ORDER — LISINOPRIL 20 MG PO TABS
20.0000 mg | ORAL_TABLET | Freq: Every day | ORAL | Status: DC
  Administered 2017-07-30: 20 mg via ORAL
  Filled 2017-07-29: qty 1

## 2017-07-29 MED ORDER — PANTOPRAZOLE SODIUM 40 MG PO TBEC: 40.0000 mg | DELAYED_RELEASE_TABLET | Freq: Every day | ORAL | Status: DC

## 2017-07-29 MED ORDER — NITROGLYCERIN 0.4 MG SL SUBL: 0.4000 mg | SUBLINGUAL_TABLET | SUBLINGUAL | Status: DC | PRN

## 2017-07-29 MED ORDER — ASPIRIN EC 81 MG PO TBEC
81.0000 mg | DELAYED_RELEASE_TABLET | Freq: Every day | ORAL | Status: DC
  Administered 2017-07-30: 81 mg via ORAL
  Filled 2017-07-29: qty 1

## 2017-07-29 MED ORDER — ONDANSETRON 4 MG PO TBDP
4.0000 mg | ORAL_TABLET | Freq: Once | ORAL | Status: AC | PRN
  Administered 2017-07-29: 4 mg via ORAL
  Filled 2017-07-29: qty 1

## 2017-07-29 MED ORDER — SODIUM CHLORIDE 0.9 % IV BOLUS (SEPSIS)
1000.0000 mL | Freq: Once | INTRAVENOUS | Status: AC
  Administered 2017-07-29: 1000 mL via INTRAVENOUS

## 2017-07-29 MED ORDER — MORPHINE SULFATE (PF) 2 MG/ML IV SOLN: 2.0000 mg | INTRAVENOUS | Status: DC | PRN

## 2017-07-29 MED ORDER — HYDROCHLOROTHIAZIDE 25 MG PO TABS
25.0000 mg | ORAL_TABLET | Freq: Every day | ORAL | Status: DC
  Administered 2017-07-30: 25 mg via ORAL
  Filled 2017-07-29: qty 1

## 2017-07-29 MED ORDER — LISINOPRIL-HYDROCHLOROTHIAZIDE 20-25 MG PO TABS: 1.0000 | ORAL_TABLET | Freq: Every day | ORAL | Status: DC

## 2017-07-29 MED ORDER — ACETAMINOPHEN 325 MG PO TABS: 650.0000 mg | ORAL_TABLET | ORAL | Status: DC | PRN

## 2017-07-29 MED ORDER — ASPIRIN 81 MG PO CHEW
324.0000 mg | CHEWABLE_TABLET | Freq: Once | ORAL | Status: AC
  Administered 2017-07-29: 324 mg via ORAL
  Filled 2017-07-29: qty 4

## 2017-07-29 MED ORDER — ENOXAPARIN SODIUM 40 MG/0.4ML ~~LOC~~ SOLN
40.0000 mg | SUBCUTANEOUS | Status: DC
  Administered 2017-07-29: 40 mg via SUBCUTANEOUS
  Filled 2017-07-29: qty 0.4

## 2017-07-29 MED ORDER — HYDROCODONE-ACETAMINOPHEN 5-325 MG PO TABS: 1.0000 | ORAL_TABLET | Freq: Four times a day (QID) | ORAL | Status: DC | PRN

## 2017-07-29 NOTE — ED Notes (Signed)
Patient adds that he is nauseated starting today but is drinking water while in lobby and tolerating PO fluids well.  paitent given Zofran ODT for nausea.

## 2017-07-29 NOTE — Progress Notes (Signed)
RN may call report to [place] [phone] at 20:00

## 2017-07-29 NOTE — H&P (Signed)
History and Physical    Ryan Snyder KGM:[id number] DOB: 1971-08-24 DOA: 07/29/2017  PCP: Iona Beard, MD   Patient coming from: Home  Chief Complaint: Fatigue, nausea, chest pressure   HPI: Ryan Snyder is a 46 y.o. male with medical history significant for obesity, GERD, insulin resistance, and hypertension, now presenting to emergency department for evaluation of fatigue, nausea, and chest pressure. Patient reports that he been in his usual state of health until developing some fatigue and mild nausea yesterday. 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 He describes this as a pressure-like sensation over the central chest, nonradiating, constant, with no exacerbating factors identified, and easing off over 20-30 minutes with rest. There was associated nausea and shortness of breath, no diaphoresis. He had experienced this sensation couple times previously, with exertion, but was not evaluated in these instances. He denies any recent cough, leg swelling or tenderness, orthopnea, fevers, or chills. He reports that his GERD has been adequately controlled with his daily Protonix.    ED Course: Upon arrival to the ED, patient is found to be afebrile, saturating well on room air, and with vitals otherwise stable. EKG features a sinus rhythm with poor R progression and chest x-ray is negative for acute cardiopulmonary disease. Chemistry panels unremarkable and CBC is within normal limits. Troponin is undetectable and d-dimer is also undetectable. Urinalysis is unremarkable. Patient was treated with a liter of normal saline and 3 and 24 mg of aspirin in the emergency department. He remained hemodynamically stable, in no apparent respiratory distress, and with no recurrence in his chest discomfort. He will be observed on telemetry unit for ongoing evaluation and management of fatigue, nausea, and chest pressure, concerning for possible  ACS.  Review of Systems:  All other systems reviewed and apart from HPI, are negative.  Past Medical History:  Diagnosis Date  . Anal fissure   . Dyslipidemia   . Elevated hemoglobin A1c   . GERD (gastroesophageal reflux disease)   . Hypertension   . Obesity   . Positive H. pylori test 04/2013    Past Surgical History:  Procedure Laterality Date  . ESOPHAGOGASTRODUODENOSCOPY  2014   sessile polyp  . MENISCUS REPAIR Left    knee     reports that he has never smoked. He quit smokeless tobacco use about 4 years ago. His smokeless tobacco use included Snuff. He reports that he does not drink alcohol or use drugs.  No Known Allergies  Family History  Problem Relation Age of Onset  . Hypertension Mother   . Hypertension Father      Prior to Admission medications   Medication Sig Start Date End Date Taking? Authorizing Provider  lisinopril-hydrochlorothiazide (PRINZIDE,ZESTORETIC) 20-25 MG per tablet Take 1 tablet by mouth daily.   Yes [provider]  pantoprazole (PROTONIX) 40 MG tablet TAKE 1 TABLET (40 MG TOTAL) BY MOUTH DAILY BEFORE SUPPER. 10/13/16  Yes Gatha Mayer, MD    Physical Exam: Vitals:   07/29/17 1218 07/29/17 1219 07/29/17 1502 07/29/17 1746  BP: (!) 146/90  (!) 142/76 132/80  Pulse: 67  72 64  Resp: 18  16 18   Temp: 98.6 F (37 C)     TempSrc: Oral     SpO2: 98%  99% 100%  Weight:  107.2 kg (236 lb 7 oz)    Height:  5\' 6"  (1.676 m)        Constitutional: NAD, calm, obese Eyes: PERTLA, lids  and conjunctivae normal ENMT: Mucous membranes are moist. Posterior pharynx clear of any exudate or lesions.   Neck: normal, supple, no masses, no thyromegaly Respiratory: clear to auscultation bilaterally, no wheezing, no crackles. Normal respiratory effort.   Cardiovascular: S1 & S2 heard, regular rate and rhythm. No extremity edema. No significant JVD. Abdomen: No distension, no tenderness, no masses palpated. Bowel sounds normal.   Musculoskeletal: no clubbing / cyanosis. No joint deformity upper and lower extremities.    Skin: no significant rashes, lesions, ulcers. Warm, dry, well-perfused. Neurologic: CN 2-12 grossly intact. Sensation intact. Strength 5/5 in all 4 limbs.  Psychiatric: Alert and oriented x 3. Calm, cooperative.     Labs on Admission: I have personally reviewed following labs and imaging studies  CBC:  Recent Labs Lab 07/29/17 1306  WBC 7.6  HGB 13.9  HCT 41.3  MCV 83.6  PLT 176   Basic Metabolic Panel:  Recent Labs Lab 07/29/17 1306  NA 137  K 4.3  CL 100*  CO2 29  GLUCOSE 130*  BUN 14  CREATININE 1.06  CALCIUM 9.6   GFR: Estimated Creatinine Clearance: 98.9 mL/min (by C-G formula based on SCr of 1.06 mg/dL). Liver Function Tests:  Recent Labs Lab 07/29/17 1306  AST 18  ALT 17  ALKPHOS 84  BILITOT 0.7  PROT 7.7  ALBUMIN 4.0    Recent Labs Lab 07/29/17 1306  LIPASE 18   No results for input(s): AMMONIA in the last 168 hours. Coagulation Profile: No results for input(s): INR, PROTIME in the last 168 hours. Cardiac Enzymes:  Recent Labs Lab 07/29/17 1643  TROPONINI <0.03   BNP (last 3 results) No results for input(s): PROBNP in the last 8760 hours. HbA1C: No results for input(s): HGBA1C in the last 72 hours. CBG: No results for input(s): GLUCAP in the last 168 hours. Lipid Profile: No results for input(s): CHOL, HDL, LDLCALC, TRIG, CHOLHDL, LDLDIRECT in the last 72 hours. Thyroid Function Tests: No results for input(s): TSH, T4TOTAL, FREET4, T3FREE, THYROIDAB in the last 72 hours. Anemia Panel: No results for input(s): VITAMINB12, FOLATE, FERRITIN, TIBC, IRON, RETICCTPCT in the last 72 hours. Urine analysis:    Component Value Date/Time   COLORURINE STRAW (A) 07/29/2017 1307   APPEARANCEUR CLEAR 07/29/2017 1307   LABSPEC 1.006 07/29/2017 1307   PHURINE 5.0 07/29/2017 1307   GLUCOSEU NEGATIVE 07/29/2017 1307   HGBUR SMALL (A) 07/29/2017 1307    BILIRUBINUR NEGATIVE 07/29/2017 1307   KETONESUR NEGATIVE 07/29/2017 1307   PROTEINUR NEGATIVE 07/29/2017 1307   NITRITE NEGATIVE 07/29/2017 1307   LEUKOCYTESUR NEGATIVE 07/29/2017 1307   Sepsis Labs: @LABRCNTIP (procalcitonin:4,lacticidven:4) )No results found for this or any previous visit (from the past 240 hour(s)).   Radiological Exams on Admission: Dg Chest 2 View  Result Date: 07/29/2017 CLINICAL DATA:  Chest tightness. EXAM: CHEST  2 VIEW COMPARISON:  July 20, 2012. FINDINGS: The cardiomediastinal silhouette is normal in size. Normal pulmonary vascularity. No focal consolidation, pleural effusion, or pneumothorax. No acute osseous abnormality. IMPRESSION: Normal chest x-ray. Electronically Signed   By: Titus Dubin M.D.   On: 07/29/2017 16:56    EKG: Independently reviewed. Sinus rhythm, poor R-progression.   Assessment/Plan  1. Chest pain  - Pt presents with fatigue, nausea, and transient chest pressure  - No known hx of CAD, but obese with HTN and hx of glucose-intolerance  - No chest discomfort since arrival in ED - No acute ischemic features on EKG, troponin undetectable, d-dimer undetectable, CXR unremarkable - He was  treated in ED with ASA 324 mg  - Plan to continue cardiac monitoring, obtain serial troponin measurements, repeat EKG   2. Hypertension  - BP is at goal  - Continue lisinopril-HCTZ    3. GERD - EGD in 2014 with polyp at [facility] (path neg for metaplasia, dysplasia, or malignancy), o/w normal  - Managed with daily Protonix, will continue     DVT prophylaxis: sq Lovenox Code Status: Full  Family Communication: Discussed with patient Disposition Plan: Observe on telemetry Consults called: None Admission status: Observation    Vianne Bulls, MD [facility] Pager [phone]  If 7PM-7AM, please contact night-coverage [email or web address] Password [email or web address]  07/29/2017, 7:02 PM

## 2017-07-29 NOTE — ED Provider Notes (Signed)
[facility] DEPT Provider Note   CSN: [id number] Arrival date & time: 07/29/17  1156     History   Chief Complaint Chief Complaint  Patient presents with  . Fatigue  . Nausea    HPI Ryan Snyder is a 46 y.o. male.  HPI    Ryan Snyder is a 46 y.o. male, with a history of GERD, obesity, dyslipidemia, and HTN, presenting to the ED with complaint of fatigue and nausea beginning this morning upon waking around 6:20am.   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 He has had similar chest discomfort before. Endorses some dizziness this morning. No pain currently.   Denies alcohol or illicit drug use. Denies vomiting/diarrhea, fever, cough, shortness of breath, headache, trauma/falls, vision changes, abdominal pain, or any other complaints. Denies recent surgery, trauma, or immobilization. No history of PE/DVT.   Past Medical History:  Diagnosis Date  . Anal fissure   . Dyslipidemia   . Elevated hemoglobin A1c   . GERD (gastroesophageal reflux disease)   . Hypertension   . Obesity   . Positive H. pylori test 04/2013    Patient Active Problem List   Diagnosis Date Noted  . Hypertension 07/29/2017  . Chest pain 07/29/2017  . GERD (gastroesophageal reflux disease) 07/10/2014  . Posterior anal fissure 08/29/2013    Past Surgical History:  Procedure Laterality Date  . ESOPHAGOGASTRODUODENOSCOPY  2014   sessile polyp  . MENISCUS REPAIR Left    knee       Home Medications    Prior to Admission medications   Medication Sig Start Date End Date Taking? Authorizing Provider  lisinopril-hydrochlorothiazide (PRINZIDE,ZESTORETIC) 20-25 MG per tablet Take 1 tablet by mouth daily.   Yes [provider]  pantoprazole (PROTONIX) 40 MG tablet TAKE 1 TABLET (40 MG TOTAL) BY MOUTH DAILY BEFORE SUPPER. 10/13/16  Yes Gatha Mayer, MD    Family History History  reviewed. No pertinent family history.  Social History Social History  Substance Use Topics  . Smoking status: Never Smoker  . Smokeless tobacco: Former Systems developer    Types: Snuff    Quit date: 07/30/2013     Comment: from [place]  . Alcohol use No     Allergies   Patient has no known allergies.   Review of Systems Review of Systems  Constitutional: Positive for fatigue. Negative for chills, diaphoresis and fever.  Eyes: Negative for visual disturbance.  Respiratory: Negative for shortness of breath.   Cardiovascular: Positive for chest pain. Negative for palpitations and leg swelling.  Gastrointestinal: Positive for nausea. Negative for abdominal pain, constipation, diarrhea and vomiting.  Musculoskeletal: Negative for back pain and neck pain.  Neurological: Positive for dizziness. Negative for syncope, numbness and headaches.  All other systems reviewed and are negative.    Physical Exam Updated Vital Signs BP (!) 142/76   Pulse 72   Temp 98.6 F (37 C) (Oral)   Resp 16   Ht 5\' 6"  (1.676 m)   Wt 107.2 kg (236 lb 7 oz)   SpO2 99%   BMI 38.16 kg/m   Physical Exam  Constitutional: He appears well-developed and well-nourished. No distress.  HENT:  Head: Normocephalic and atraumatic.  Mouth/Throat: Oropharynx is clear and moist.  Eyes: Conjunctivae are normal.  Neck: Neck supple.  Cardiovascular: Normal rate, regular rhythm, normal heart sounds and intact distal pulses.   Pulmonary/Chest: Effort normal and breath sounds  normal. No respiratory distress. He exhibits no tenderness.  Abdominal: Soft. There is no tenderness. There is no guarding.  Musculoskeletal: He exhibits no edema.  Lymphadenopathy:    He has no cervical adenopathy.  Neurological: He is alert.  No sensory deficits. Strength 5/5 in all extremities. No gait disturbance. Coordination intact including heel to shin and finger to nose. Cranial nerves III-XII grossly intact. No facial droop.   Skin: Skin  is warm and dry. Capillary refill takes less than 2 seconds. He is not diaphoretic.  Psychiatric: He has a normal mood and affect. His behavior is normal.  Nursing note and vitals reviewed.    ED Treatments / Results  Labs (all labs ordered are listed, but only abnormal results are displayed) Labs Reviewed  COMPREHENSIVE METABOLIC PANEL - Abnormal; Notable for the following:       Result Value   Chloride 100 (*)    Glucose, Bld 130 (*)    All other components within normal limits  URINALYSIS, ROUTINE W REFLEX MICROSCOPIC - Abnormal; Notable for the following:    Color, Urine STRAW (*)    Hgb urine dipstick SMALL (*)    All other components within normal limits  LIPASE, BLOOD  CBC  D-DIMER, QUANTITATIVE (NOT AT [facility])  TROPONIN I  HIV ANTIBODY (ROUTINE TESTING)  BASIC METABOLIC PANEL  CBC    EKG  EKG Interpretation  Date/Time:  Thursday July 29 2017 17:48:03 EDT Ventricular Rate:  60 PR Interval:    QRS Duration: 92 QT Interval:  381 QTC Calculation: 381 R Axis:   25 Text Interpretation:  Sinus rhythm Abnormal R-wave progression, early transition Since last tracing rate slower Confirmed by Dorie Rank [phone]) on 07/29/2017 5:52:38 PM       Radiology Dg Chest 2 View  Result Date: 07/29/2017 CLINICAL DATA:  Chest tightness. EXAM: CHEST  2 VIEW COMPARISON:  July 20, 2012. FINDINGS: The cardiomediastinal silhouette is normal in size. Normal pulmonary vascularity. No focal consolidation, pleural effusion, or pneumothorax. No acute osseous abnormality. IMPRESSION: Normal chest x-ray. Electronically Signed   By: Titus Dubin M.D.   On: 07/29/2017 16:56    Procedures Procedures (including critical care time)  Medications Ordered in ED Medications  aspirin chewable tablet 324 mg (not administered)  pantoprazole (PROTONIX) EC tablet 40 mg (not administered)  lisinopril-hydrochlorothiazide (PRINZIDE,ZESTORETIC) 20-25 MG per tablet 1 tablet (not administered)    aspirin EC tablet 81 mg (not administered)  nitroGLYCERIN (NITROSTAT) SL tablet 0.4 mg (not administered)  acetaminophen (TYLENOL) tablet 650 mg (not administered)  ondansetron (ZOFRAN) injection 4 mg (not administered)  enoxaparin (LOVENOX) injection 40 mg (not administered)  labetalol (NORMODYNE,TRANDATE) injection 10 mg (not administered)  HYDROcodone-acetaminophen (NORCO/VICODIN) 5-325 MG per tablet 1-2 tablet (not administered)  morphine 2 MG/ML injection 2-4 mg (not administered)  ondansetron (ZOFRAN-ODT) disintegrating tablet 4 mg (4 mg Oral Given 07/29/17 1224)  sodium chloride 0.9 % bolus 1,000 mL (0 mLs Intravenous Stopped 07/29/17 1745)     Initial Impression / Assessment and Plan / ED Course  I have reviewed the triage vital signs and the nursing notes.  Pertinent labs & imaging results that were available during my care of the patient were reviewed by me and considered in my medical decision making (see chart for details).  Clinical Course as of Jul 29 1841  Thu Jul 29, 2017  1829 Patient voices some improvement since arrival. Has not had a recurrence of his chest pain or nausea. Admission for chest pain rule out discussed  with patient. Patient agrees to the plan.  [SJ]  [place] with Dr. Myna Hidalgo, hospitalist, who agrees to admit the patient.  [SJ]    Clinical Course User Index [SJ] Rayhan Groleau C, PA-C    Patient presents with fatigue, nausea, and an episode of chest pain. Initial set of labs results are encouraging, but chest pain episode is suspicious.  HEART score is 4, indicating moderate risk for a cardiac event. Wells criteria score is 0, indicating low risk for PE. Patient admitted for chest pain rule out.   Findings and plan of care discussed with Dorie Rank, MD.    Vitals:   07/29/17 1218 07/29/17 1219 07/29/17 1502 07/29/17 1746  BP: (!) 146/90  (!) 142/76 132/80  Pulse: 67  72 64  Resp: 18  16 18   Temp: 98.6 F (37 C)     TempSrc: Oral     SpO2: 98%  99%  100%  Weight:  107.2 kg (236 lb 7 oz)    Height:  5\' 6"  (1.676 m)     Orthostatic VS for the past 24 hrs:  BP- Lying Pulse- Lying BP- Sitting Pulse- Sitting BP- Standing at 0 minutes Pulse- Standing at 0 minutes  07/29/17 1741 128/74 61 132/84 62 132/80 64     Final Clinical Impressions(s) / ED Diagnoses   Final diagnoses:  Chest pain, unspecified type  Fatigue, unspecified type    New Prescriptions New Prescriptions   No medications on file     Layla Maw 07/29/17 1842    Lorayne Bender, PA-C 07/29/17 1843    Dorie Rank, MD 07/31/17 [phone]

## 2017-07-29 NOTE — ED Notes (Signed)
In process of orthostatic VS and DG appears at bedside. Will redo/complete orthostatic VS and EKG with pt return.

## 2017-07-29 NOTE — ED Triage Notes (Signed)
Patient c/o feeling tired since yesterday. Patient states that at work today he couldn't stay so wanted to come get checked out.

## 2017-07-30 ENCOUNTER — Other Ambulatory Visit: Payer: Self-pay | Admitting: Cardiology

## 2017-07-30 DIAGNOSIS — R079 Chest pain, unspecified: Secondary | ICD-10-CM

## 2017-07-30 DIAGNOSIS — R072 Precordial pain: Secondary | ICD-10-CM

## 2017-07-30 LAB — CBC
HCT: 38.6 % — ABNORMAL LOW (ref 39.0–52.0)
Hemoglobin: 12.8 g/dL — ABNORMAL LOW (ref 13.0–17.0)
MCH: 27.8 pg (ref 26.0–34.0)
MCHC: 33.2 g/dL (ref 30.0–36.0)
MCV: 83.9 fL (ref 78.0–100.0)
PLATELETS: 362 10*3/uL (ref 150–400)
RBC: 4.6 MIL/uL (ref 4.22–5.81)
RDW: 13.9 % (ref 11.5–15.5)
WBC: 7.9 10*3/uL (ref 4.0–10.5)

## 2017-07-30 LAB — TROPONIN I: Troponin I: <0.03 ng/mL (ref <0.03)

## 2017-07-30 LAB — BASIC METABOLIC PANEL
Anion gap: 6 (ref 5–15)
BUN: 13 mg/dL (ref 6–20)
CHLORIDE: 103 mmol/L (ref 101–111)
CO2: 29 mmol/L (ref 22–32)
CREATININE: 0.95 mg/dL (ref 0.61–1.24)
Calcium: 8.7 mg/dL — ABNORMAL LOW (ref 8.9–10.3)
GFR calc Af Amer: >60 mL/min (ref >60)
GFR calc non Af Amer: >60 mL/min (ref >60)
GLUCOSE: 96 mg/dL (ref 65–99)
Potassium: 3.9 mmol/L (ref 3.5–5.1)
Sodium: 138 mmol/L (ref 135–145)

## 2017-07-30 LAB — HIV ANTIBODY (ROUTINE TESTING W REFLEX): HIV Screen 4th Generation wRfx: NONREACTIVE

## 2017-07-30 MED ORDER — ASPIRIN 81 MG PO TBEC: 81.0000 mg | DELAYED_RELEASE_TABLET | Freq: Every day | ORAL | 0 refills | Status: DC

## 2017-07-30 NOTE — Progress Notes (Signed)
   Outpatient stress test order has been placed. Our office will call patient with appt time.   Ryan Snyder

## 2017-07-30 NOTE — Consult Note (Signed)
Cardiology Consultation:   Patient ID: Ryan Snyder; [id number]; 1971/07/05   Admit date: 07/29/2017 Date of Consult: 07/30/2017  Primary Care Provider: Iona Beard, MD Primary Cardiologist: New    Patient Profile:   Ryan Snyder is a 46 y.o. male with a hx of GERD/ esophagitis, followed by Dr. Carlean Purl, as well as h/o prediabetes, HTN and DLD who is being seen today for the evaluation of chest pain at the request of Dr. Maylene Roes.  History of Present Illness:   Mr. [name] was in his usual state of health until yesterday morning. He was at work. 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 Around 9:30 am he started to feel unwell. He felt dizzy and tired then had substernal chest pain described as chest pressure. No radiation. No associated dyspnea or palpitations. It did not worsen with exertion. He did not eat breakfast prior to going to work. He had tea and milk earlier. Symptoms lasted ~30 min and resolved spontaneously w/o any treatment. He notes it did not feel like his typical reflux, thus he came to the ED.   He was CP free upon arrival. EKG shows sinus rhythm with abnormal R-wave progression, early transition. Troponin is negative x 1. D-dimer negative. CBC with mild anemia at 12.8. BMP with normal renal function and K. Ca is 8.7. CXR unremarkable.   He denies any further recurrence since his initial episode yesterday morning, which lasted 30 min and resolved spontaneously. He denies any recent exertional symptoms or limitation with exercise. He has stairs in his home and goes up and down them frequently, w/o symptoms. He also walks several days a week for exercise. On the weekends, he walks 3-4 miles and denies exertional CP or dyspnea.   Past Medical History:  Diagnosis Date  . Anal fissure   . Dyslipidemia   . Elevated hemoglobin A1c   . GERD (gastroesophageal reflux disease)   . Hypertension   . Obesity   . Positive H. pylori test 04/2013    Past Surgical History:    Procedure Laterality Date  . ESOPHAGOGASTRODUODENOSCOPY  2014   sessile polyp  . MENISCUS REPAIR Left    knee     Home Medications:  Prior to Admission medications   Medication Sig Start Date End Date Taking? Authorizing Provider  lisinopril-hydrochlorothiazide (PRINZIDE,ZESTORETIC) 20-25 MG per tablet Take 1 tablet by mouth daily.   Yes [provider]  pantoprazole (PROTONIX) 40 MG tablet TAKE 1 TABLET (40 MG TOTAL) BY MOUTH DAILY BEFORE SUPPER. 10/13/16  Yes Gatha Mayer, MD    Inpatient Medications: Scheduled Meds: . aspirin EC  81 mg Oral Daily  . enoxaparin (LOVENOX) injection  40 mg Subcutaneous Q24H  . lisinopril  20 mg Oral Daily   And  . hydrochlorothiazide  25 mg Oral Daily  . pantoprazole  40 mg Oral Q supper   Continuous Infusions:  PRN Meds: acetaminophen, HYDROcodone-acetaminophen, labetalol, morphine injection, nitroGLYCERIN, ondansetron (ZOFRAN) IV  Allergies:   No Known Allergies  Social History:   Social History   Social History  . Marital status: Married    Spouse name: N/A  . Number of children: N/A  . Years of education: N/A   Occupational History  . Not on file.   Social History Main Topics  . Smoking status: Never Smoker  . Smokeless tobacco: Former Systems developer    Types: Snuff    Quit date: 07/30/2013     Comment: from [place] country  . Alcohol use No  .  Drug use: No  . Sexual activity: Not on file   Other Topics Concern  . Not on file   Social History Narrative   Married 1 son (2010) and 1 daughter (2012)   Sport and exercise psychologist in IT at [employer]    Family History:    Family History  Problem Relation Age of Onset  . Hypertension Mother   . Hypertension Father      ROS:  Please see the history of present illness.  ROS  All other ROS reviewed and negative.     Physical Exam/Data:   Vitals:   07/29/17 1746 07/29/17 2010 07/29/17 2135 07/30/17 0455  BP: 132/80 128/85 132/83 127/77  Pulse: 64 (!) 55 63 (!) 59  Resp: 18  (!) 26 20 20   Temp:  98 F (36.7 C) 97.8 F (36.6 C) 98.2 F (36.8 C)  TempSrc:   Oral Oral  SpO2: 100% 100% 100% 100%  Weight:   236 lb 5.3 oz (107.2 kg)   Height:   5\' 6"  (1.676 m)     Intake/Output Summary (Last 24 hours) at 07/30/17 0926 Last data filed at 07/30/17 0618  Gross per 24 hour  Intake             1360 ml  Output                0 ml  Net             1360 ml   Filed Weights   07/29/17 1219 07/29/17 2135  Weight: 236 lb 7 oz (107.2 kg) 236 lb 5.3 oz (107.2 kg)   Body mass index is 38.15 kg/m.  General:  Well nourished, well developed, in no acute distress HEENT: normal Lymph: no adenopathy Neck: no JVD Endocrine:  No thryomegaly Vascular: No carotid bruits; FA pulses 2+ bilaterally without bruits  Cardiac:  normal S1, S2; RRR; no murmur  Lungs:  clear to auscultation bilaterally, no wheezing, rhonchi or rales  Abd: soft, nontender, no hepatomegaly  Ext: no edema Musculoskeletal:  No deformities, BUE and BLE strength normal and equal Skin: warm and dry  Neuro:  CNs 2-12 intact, no focal abnormalities noted Psych:  Normal affect   EKG:  The EKG was personally reviewed and demonstrates:  NSR with abnormal R-wave progression, early transition. Telemetry:  Telemetry was personally reviewed and demonstrates:  NSR  Relevant CV Studies: None   Laboratory Data:  Chemistry Recent Labs Lab 07/29/17 1306 07/30/17 0442  NA 137 138  K 4.3 3.9  CL 100* 103  CO2 29 29  GLUCOSE 130* 96  BUN 14 13  CREATININE 1.06 0.95  CALCIUM 9.6 8.7*  GFRNONAA >60 >60  GFRAA >60 >60  ANIONGAP 8 6     Recent Labs Lab 07/29/17 1306  PROT 7.7  ALBUMIN 4.0  AST 18  ALT 17  ALKPHOS 84  BILITOT 0.7   Hematology Recent Labs Lab 07/29/17 1306 07/30/17 0442  WBC 7.6 7.9  RBC 4.94 4.60  HGB 13.9 12.8*  HCT 41.3 38.6*  MCV 83.6 83.9  MCH 28.1 27.8  MCHC 33.7 33.2  RDW 13.7 13.9  PLT 384 362   Cardiac Enzymes Recent Labs Lab 07/29/17 1643  TROPONINI  <0.03   No results for input(s): TROPIPOC in the last 168 hours.  BNPNo results for input(s): BNP, PROBNP in the last 168 hours.  DDimer  Recent Labs Lab 07/29/17 1638  DDIMER <0.27    Radiology/Studies:  Dg Chest 2  View  Result Date: 07/29/2017 CLINICAL DATA:  Chest tightness. EXAM: CHEST  2 VIEW COMPARISON:  July 20, 2012. FINDINGS: The cardiomediastinal silhouette is normal in size. Normal pulmonary vascularity. No focal consolidation, pleural effusion, or pneumothorax. No acute osseous abnormality. IMPRESSION: Normal chest x-ray. Electronically Signed   By: Titus Dubin M.D.   On: 07/29/2017 16:56    Assessment and Plan:   1. 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 Episode was approx 24 hrs ago. He denies any further recurrence. No exertional CP or dyspnea (walks reguarlly for exercise and has stairs in home>> asymptomatic w/ activity) . EKG shows abnormal R-wave progression, early transition. Initial troponin negative. He has h/o GERD and esophagitis, followed by Dr. Carlean Purl and has been managed with PPI therapy. Continue to cycle cardiac enzymes x 3. If he rules out, can consider outpatient nuclear stress testing.   MD to follow with further recommendations.    For questions or updates, please contact [place] Please consult [email or web address] for contact info under Cardiology/STEMI.   Signed, Lyda Jester, PA-C  07/30/2017 9:26 AM   I have seen and examined the patient along with Lyda Jester, PA-C.  I have reviewed the chart, notes and new data.  I agree with PA's note.  PLAN: Low risk for major acute cardiac event. Schedule for outpatient nuclear stress test and Cardiology office follow up.  Sanda Klein, MD, [place] [phone] 07/30/2017, 11:19 AM

## 2017-07-30 NOTE — Discharge Summary (Signed)
Physician Discharge Summary  Ryan Snyder [id number] DOB: 1971/02/27 DOA: 07/29/2017  PCP: Iona Beard, MD  Admit date: 07/29/2017 Discharge date: 07/30/2017  Admitted From: Home Disposition:  Home  Recommendations for Outpatient Follow-up:  1. Follow up with PCP in 1 week 2. Follow up with Cardiology for outpatient stress test  Discharge Condition: Stable CODE STATUS: Full  Diet recommendation: Heart healthy   Brief/Interim Summary: From H&P by Dr. Myna Hidalgo: Ryan Snyder is a 46 y.o. male with medical history significant for obesity, GERD, insulin resistance, and hypertension, now presenting to emergency department for evaluation of fatigue, nausea, and chest pressure. Patient reports that he been in his usual state of health until developing some fatigue and mild nausea yesterday. 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 He describes this as a pressure-like sensation over the central chest, nonradiating, constant, with no exacerbating factors identified, and easing off over 20-30 minutes with rest. There was associated nausea and shortness of breath, no diaphoresis. He had experienced this sensation couple times previously, with exertion, but was not evaluated in these instances. He denies any recent cough, leg swelling or tenderness, orthopnea, fevers, or chills. He reports that his GERD has been adequately controlled with his daily Protonix.    ED Course: Upon arrival to the ED, patient is found to be afebrile, saturating well on room air, and with vitals otherwise stable. EKG features a sinus rhythm with poor R progression and chest x-ray is negative for acute cardiopulmonary disease. Chemistry panels unremarkable and CBC is within normal limits. Troponin is undetectable and d-dimer is also undetectable. Urinalysis is unremarkable. Patient was treated with a liter of normal saline and 3 and 24 mg of aspirin in the emergency  department. He remained hemodynamically stable, in no apparent respiratory distress, and with no recurrence in his chest discomfort. He will be observed on telemetry unit for ongoing evaluation and management of fatigue, nausea, and chest pressure, concerning for possible ACS.   Interim: EKG was obtained without significant ST-T wave changes, troponin was trended which were negative. Cardiology was consulted and recommended outpatient stress test. Patient remained chest pain-free during hospitalization.  Discharge Diagnoses:  Principal Problem:   Chest pain Active Problems:   GERD (gastroesophageal reflux disease)   Hypertension  1. Chest pain  - No acute ischemic features on EKG, troponin negative, d-dimer undetectable, CXR unremarkable - Follow up for outpatient stress test. Cardiology will arrange  - Asprin   2. Hypertension  - BP is at goal  - Continue lisinopril-HCTZ    3. GERD - EGD in 2014 with polyp at [address] (path neg for metaplasia, dysplasia, or malignancy)  - Managed with daily Protonix, will continue    Discharge Instructions  Discharge Instructions    Call MD for:    Complete by:  As directed    Chest pain   Call MD for:  difficulty breathing, headache or visual disturbances    Complete by:  As directed    Call MD for:  extreme fatigue    Complete by:  As directed    Call MD for:  hives    Complete by:  As directed    Call MD for:  persistant dizziness or light-headedness    Complete by:  As directed    Call MD for:  persistant nausea and vomiting    Complete by:  As directed    Call MD for:  severe uncontrolled pain  Complete by:  As directed    Call MD for:  temperature >100.4    Complete by:  As directed    Diet - low sodium heart healthy    Complete by:  As directed    Discharge instructions    Complete by:  As directed    You were cared for by a hospitalist during your hospital stay. If you have any questions about your discharge medications or  the care you received while you were in the hospital after you are discharged, you can call the unit and asked to speak with the hospitalist on call if the hospitalist that took care of you is not available. Once you are discharged, your primary care physician will handle any further medical issues. Please note that NO REFILLS for any discharge medications will be authorized once you are discharged, as it is imperative that you return to your primary care physician (or establish a relationship with a primary care physician if you do not have one) for your aftercare needs so that they can reassess your need for medications and monitor your lab values.   Increase activity slowly    Complete by:  As directed      Allergies as of 07/30/2017   No Known Allergies     Medication List    TAKE these medications   aspirin 81 MG EC tablet Take 1 tablet (81 mg total) by mouth daily.   lisinopril-hydrochlorothiazide 20-25 MG tablet Commonly known as:  PRINZIDE,ZESTORETIC Take 1 tablet by mouth daily.   pantoprazole 40 MG tablet Commonly known as:  PROTONIX TAKE 1 TABLET (40 MG TOTAL) BY MOUTH DAILY BEFORE SUPPER.            Discharge Care Instructions        Start     Ordered   07/31/17 0000  aspirin EC 81 MG EC tablet  Daily     07/30/17 1306   07/30/17 0000  Increase activity slowly     07/30/17 1306   07/30/17 0000  Diet - low sodium heart healthy     07/30/17 1306   07/30/17 0000  Discharge instructions    Comments:  You were cared for by a hospitalist during your hospital stay. If you have any questions about your discharge medications or the care you received while you were in the hospital after you are discharged, you can call the unit and asked to speak with the hospitalist on call if the hospitalist that took care of you is not available. Once you are discharged, your primary care physician will handle any further medical issues. Please note that NO REFILLS for any discharge  medications will be authorized once you are discharged, as it is imperative that you return to your primary care physician (or establish a relationship with a primary care physician if you do not have one) for your aftercare needs so that they can reassess your need for medications and monitor your lab values.   07/30/17 1306   07/30/17 0000  Call MD for:  temperature >100.4     07/30/17 1306   07/30/17 0000  Call MD for:  persistant nausea and vomiting     07/30/17 1306   07/30/17 0000  Call MD for:  extreme fatigue     07/30/17 1306   07/30/17 0000  Call MD for:  persistant dizziness or light-headedness     07/30/17 1306   07/30/17 0000  Call MD for:  hives  07/30/17 1306   07/30/17 0000  Call MD for:  difficulty breathing, headache or visual disturbances     07/30/17 1306   07/30/17 0000  Call MD for:  severe uncontrolled pain     07/30/17 1306   07/30/17 0000  Call MD for:    Comments:  Chest pain   07/30/17 1306     Follow-up Information    Iona Beard, MD. Schedule an appointment as soon as possible for a visit in 1 week(s).   Specialty:  Family Medicine Contact information: [address] [place] [address] [phone]        [place] [facility] Follow up.   Specialty:  Cardiology Why:  Cardiology office will call you to schedule outpatient stress test.   Contact information: [address] [place] [phone]         No Known Allergies  Consultations:  Cardiology    Procedures/Studies: Dg Chest 2 View  Result Date: 07/29/2017 CLINICAL DATA:  Chest tightness. EXAM: CHEST  2 VIEW COMPARISON:  July 20, 2012. FINDINGS: The cardiomediastinal silhouette is normal in size. Normal pulmonary vascularity. No focal consolidation, pleural effusion, or pneumothorax. No acute osseous abnormality. IMPRESSION: Normal chest x-ray. Electronically Signed   By: Titus Dubin M.D.   On: 07/29/2017 16:56       Discharge Exam: Vitals:   07/30/17 0455 07/30/17 1105  BP: 127/77 117/68  Pulse: (!) 59 (!) 58  Resp: 20   Temp: 98.2 F (36.8 C)   SpO2: 100% 100%   Vitals:   07/29/17 2010 07/29/17 2135 07/30/17 0455 07/30/17 1105  BP: 128/85 132/83 127/77 117/68  Pulse: (!) 55 63 (!) 59 (!) 58  Resp: (!) 26 20 20    Temp: 98 F (36.7 C) 97.8 F (36.6 C) 98.2 F (36.8 C)   TempSrc:  Oral Oral   SpO2: 100% 100% 100% 100%  Weight:  107.2 kg (236 lb 5.3 oz)    Height:  5\' 6"  (1.676 m)      General: Pt is alert, awake, not in acute distress Cardiovascular: Regular rhythm rate 50s, S1/S2 +, no rubs, no gallops Respiratory: CTA bilaterally, no wheezing, no rhonchi Abdominal: Soft, NT, ND, bowel sounds + Extremities: no edema, no cyanosis    The results of significant diagnostics from this hospitalization (including imaging, microbiology, ancillary and laboratory) are listed below for reference.     Microbiology: No results found for this or any previous visit (from the past 240 hour(s)).   Labs: BNP (last 3 results) No results for input(s): BNP in the last 8760 hours. Basic Metabolic Panel:  Recent Labs Lab 07/29/17 1306 07/30/17 0442  NA 137 138  K 4.3 3.9  CL 100* 103  CO2 29 29  GLUCOSE 130* 96  BUN 14 13  CREATININE 1.06 0.95  CALCIUM 9.6 8.7*   Liver Function Tests:  Recent Labs Lab 07/29/17 1306  AST 18  ALT 17  ALKPHOS 84  BILITOT 0.7  PROT 7.7  ALBUMIN 4.0    Recent Labs Lab 07/29/17 1306  LIPASE 18   No results for input(s): AMMONIA in the last 168 hours. CBC:  Recent Labs Lab 07/29/17 1306 07/30/17 0442  WBC 7.6 7.9  HGB 13.9 12.8*  HCT 41.3 38.6*  MCV 83.6 83.9  PLT 384 362   Cardiac Enzymes:  Recent Labs Lab 07/29/17 1643 07/30/17 1146  TROPONINI <0.03 <0.03   BNP: Invalid input(s): POCBNP CBG:  No results for input(s): GLUCAP in the last 168 hours. D-Dimer  Recent Labs  07/29/17 1638  DDIMER <0.27   Hgb A1c No  results for input(s): HGBA1C in the last 72 hours. Lipid Profile No results for input(s): CHOL, HDL, LDLCALC, TRIG, CHOLHDL, LDLDIRECT in the last 72 hours. Thyroid function studies No results for input(s): TSH, T4TOTAL, T3FREE, THYROIDAB in the last 72 hours.  Invalid input(s): FREET3 Anemia work up No results for input(s): VITAMINB12, FOLATE, FERRITIN, TIBC, IRON, RETICCTPCT in the last 72 hours. Urinalysis    Component Value Date/Time   COLORURINE STRAW (A) 07/29/2017 1307   APPEARANCEUR CLEAR 07/29/2017 1307   LABSPEC 1.006 07/29/2017 1307   PHURINE 5.0 07/29/2017 1307   GLUCOSEU NEGATIVE 07/29/2017 1307   HGBUR SMALL (A) 07/29/2017 1307   BILIRUBINUR NEGATIVE 07/29/2017 1307   KETONESUR NEGATIVE 07/29/2017 1307   PROTEINUR NEGATIVE 07/29/2017 1307   NITRITE NEGATIVE 07/29/2017 1307   LEUKOCYTESUR NEGATIVE 07/29/2017 1307   Sepsis Labs Invalid input(s): PROCALCITONIN,  WBC,  LACTICIDVEN Microbiology No results found for this or any previous visit (from the past 240 hour(s)).   Time coordinating discharge: 40 minutes  SIGNED:  Dessa Phi, DO [facility] Pager [phone]  If 7PM-7AM, please contact night-coverage [email or web address] Password [email or web address] 07/30/2017, 1:43 PM

## 2017-10-30 ENCOUNTER — Other Ambulatory Visit: Payer: Self-pay | Admitting: Internal Medicine

## 2019-08-18 ENCOUNTER — Encounter: Payer: Self-pay | Admitting: Registered Nurse

## 2019-08-18 ENCOUNTER — Other Ambulatory Visit: Payer: Self-pay

## 2019-08-18 ENCOUNTER — Ambulatory Visit (INDEPENDENT_AMBULATORY_CARE_PROVIDER_SITE_OTHER): Payer: BC Managed Care – PPO | Admitting: Registered Nurse

## 2019-08-18 VITALS — BP 122/82 | HR 69 | Temp 98.8°F | Resp 18 | Ht 65.35 in | Wt 257.0 lb

## 2019-08-18 DIAGNOSIS — Z7689 Persons encountering health services in other specified circumstances: Secondary | ICD-10-CM | POA: Diagnosis not present

## 2019-08-18 DIAGNOSIS — Z13220 Encounter for screening for lipoid disorders: Secondary | ICD-10-CM | POA: Diagnosis not present

## 2019-08-18 DIAGNOSIS — M25572 Pain in left ankle and joints of left foot: Secondary | ICD-10-CM

## 2019-08-18 DIAGNOSIS — Z1329 Encounter for screening for other suspected endocrine disorder: Secondary | ICD-10-CM

## 2019-08-18 DIAGNOSIS — I10 Essential (primary) hypertension: Secondary | ICD-10-CM | POA: Diagnosis not present

## 2019-08-18 DIAGNOSIS — Z13228 Encounter for screening for other metabolic disorders: Secondary | ICD-10-CM

## 2019-08-18 DIAGNOSIS — Z130 Encounter for screening for diseases of the blood and blood-forming organs and certain disorders involving the immune mechanism: Secondary | ICD-10-CM

## 2019-08-18 MED ORDER — LISINOPRIL 20 MG PO TABS: 20.0000 mg | ORAL_TABLET | Freq: Every day | ORAL | 3 refills | Status: AC

## 2019-08-18 NOTE — Progress Notes (Signed)
Established Patient Office Visit  Subjective:  Patient ID: Ryan Snyder, male    DOB: 08-30-1971  Age: 48 y.o. MRN: [id number]  CC:  Chief Complaint  Patient presents with  . Establish Care    need new pcp to manage medications and chronic conditions   . Medication Refill    HPI Ryan Snyder presents for [facility] and med refills  Treated for HTN. He takes 20mg  Lisinopril PO qd with good effect. BP is exemplary in office today.  He states he is also having some ankle pain. 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 Pain is where his heel strikes. He admittedly overpronates. He is interested in a sports medicine referral for further workup.   Past Medical History:  Diagnosis Date  . Anal fissure   . Dyslipidemia   . Elevated hemoglobin A1c   . GERD (gastroesophageal reflux disease)   . Hypertension   . Obesity   . Positive H. pylori test 04/2013    Past Surgical History:  Procedure Laterality Date  . ESOPHAGOGASTRODUODENOSCOPY  2014   sessile polyp  . MENISCUS REPAIR Left    knee    Family History  Problem Relation Age of Onset  . Hypertension Mother   . Hypertension Father     Social History   Socioeconomic History  . Marital status: Married    Spouse name: Not on file  . Number of children: Not on file  . Years of education: Not on file  . Highest education level: Not on file  Occupational History  . Not on file  Social Needs  . Financial resource strain: Not on file  . Food insecurity    Worry: Not on file    Inability: Not on file  . Transportation needs    Medical: Not on file    Non-medical: Not on file  Tobacco Use  . Smoking status: Never Smoker  . Smokeless tobacco: Former Systems developer    Types: Snuff  . Tobacco comment: from his country  Substance and Sexual Activity  . Alcohol use: No  . Drug use: No  . Sexual activity: Not on file  Lifestyle  . Physical activity    Days per week: Not on file    Minutes per session: Not on file  .  Stress: Not on file  Relationships  . Social Herbalist on phone: Not on file    Gets together: Not on file    Attends religious service: Not on file    Active member of club or organization: Not on file    Attends meetings of clubs or organizations: Not on file    Relationship status: Not on file  . Intimate partner violence    Fear of current or ex partner: Not on file    Emotionally abused: Not on file    Physically abused: Not on file    Forced sexual activity: Not on file  Other Topics Concern  . Not on file  Social History Narrative   Married 1 son (2010) and 1 daughter (2012)   Sport and exercise psychologist in IT at [employer]    Outpatient Medications Prior to Visit  Medication Sig Dispense Refill  . lisinopril-hydrochlorothiazide (PRINZIDE,ZESTORETIC) 20-25 MG per tablet Take 1 tablet by mouth daily.    Marland Kitchen aspirin EC 81 MG EC tablet Take 1 tablet (81 mg total) by mouth daily. 30 tablet 0  . pantoprazole (PROTONIX) 40 MG tablet TAKE 1 TABLET  BY MOUTH DAILY BEFORE SUPPER 30 tablet 9   No facility-administered medications prior to visit.     No Known Allergies  ROS Review of Systems  Constitutional: Negative.   HENT: Negative.   Eyes: Negative.   Respiratory: Negative.   Cardiovascular: Negative.   Gastrointestinal: Negative.   Endocrine: Negative.   Genitourinary: Negative.   Musculoskeletal: Positive for arthralgias (ankle pain) and gait problem (over pronation). Negative for back pain, joint swelling, myalgias, neck pain and neck stiffness.  Skin: Negative.   Allergic/Immunologic: Negative.   Hematological: Negative.   Psychiatric/Behavioral: Negative.   All other systems reviewed and are negative.     Objective:    Physical Exam  Constitutional: He is oriented to person, place, and time. He appears well-developed and well-nourished. No distress.  Cardiovascular: Normal rate and regular rhythm.  Pulmonary/Chest: Effort normal. No respiratory distress.   Neurological: He is alert and oriented to person, place, and time.  Skin: Skin is warm and dry. No rash noted. He is not diaphoretic. No erythema. No pallor.  Psychiatric: He has a normal mood and affect. His behavior is normal. Judgment and thought content normal.  Nursing note and vitals reviewed.   BP 122/82   Pulse 69   Temp 98.8 F (37.1 C) (Oral)   Resp 18   Ht 5' 5.35" (1.66 m)   Wt 257 lb (116.6 kg)   SpO2 98%   BMI 42.30 kg/m  Wt Readings from Last 3 Encounters:  08/18/19 257 lb (116.6 kg)  07/29/17 236 lb 5.3 oz (107.2 kg)  10/13/16 240 lb (108.9 kg)     There are no preventive care reminders to display for this patient.  There are no preventive care reminders to display for this patient.  No results found for: TSH Lab Results  Component Value Date   WBC 7.9 07/30/2017   HGB 12.8 (L) 07/30/2017   HCT 38.6 (L) 07/30/2017   MCV 83.9 07/30/2017   PLT 362 07/30/2017   Lab Results  Component Value Date   NA 138 07/30/2017   K 3.9 07/30/2017   CHLORIDE 107 02/22/2014   CO2 29 07/30/2017   GLUCOSE 96 07/30/2017   BUN 13 07/30/2017   CREATININE 0.95 07/30/2017   BILITOT 0.7 07/29/2017   ALKPHOS 84 07/29/2017   AST 18 07/29/2017   ALT 17 07/29/2017   PROT 7.7 07/29/2017   ALBUMIN 4.0 07/29/2017   CALCIUM 8.7 (L) 07/30/2017   ANIONGAP 6 07/30/2017   No results found for: CHOL No results found for: HDL No results found for: LDLCALC No results found for: TRIG No results found for: CHOLHDL No results found for: HGBA1C    Assessment & Plan:   Problem List Items Addressed This Visit      Cardiovascular and Mediastinum   Hypertension   Relevant Medications   lisinopril (ZESTRIL) 20 MG tablet    Other Visit Diagnoses    Screening for endocrine, metabolic and immunity disorder    -  Primary   Relevant Orders   CBC with Differential/Platelet   Comprehensive metabolic panel   Hemoglobin A1c   Lipid screening       Relevant Orders   Lipid panel       Meds ordered this encounter  Medications  . lisinopril (ZESTRIL) 20 MG tablet    Sig: Take 1 tablet (20 mg total) by mouth daily.    Dispense:  90 tablet    Refill:  3    Order Specific Question:  Supervising Provider    Answer:   Forrest Moron [id number]    Follow-up: No follow-ups on file.   PLAN  Refill lisinopril 20mg  PO qd  Labs ordered. Will follow up as warranted  Patient encouraged to call clinic with any questions, comments, or concerns.   Maximiano Coss, NP

## 2019-08-18 NOTE — Patient Instructions (Signed)
° ° ° °  If you have lab work done today you will be contacted with your lab results within the next 2 weeks.  If you have not heard from us then please contact us. The fastest way to get your results is to register for My Chart. ° ° °IF you received an x-ray today, you will receive an invoice from [facility]. Please contact [facility] at [phone] with questions or concerns regarding your invoice.  ° °IF you received labwork today, you will receive an invoice from [facility]. Please contact [facility] at [phone] with questions or concerns regarding your invoice.  ° °Our billing staff will not be able to assist you with questions regarding bills from these companies. ° °You will be contacted with the lab results as soon as they are available. The fastest way to get your results is to activate your My Chart account. Instructions are located on the last page of this paperwork. If you have not heard from us regarding the results in 2 weeks, please contact this office. °  ° ° ° °

## 2019-08-19 LAB — CBC WITH DIFFERENTIAL/PLATELET
Basophils Absolute: 0.1 10*3/uL (ref 0.0–0.2)
Basos: 1 %
EOS (ABSOLUTE): 0.4 10*3/uL (ref 0.0–0.4)
Eos: 5 %
Hematocrit: 41.4 % (ref 37.5–51.0)
Hemoglobin: 13.6 g/dL (ref 13.0–17.7)
Immature Grans (Abs): 0 10*3/uL (ref 0.0–0.1)
Immature Granulocytes: 0 %
Lymphocytes Absolute: 2.5 10*3/uL (ref 0.7–3.1)
Lymphs: 33 %
MCH: 27.3 pg (ref 26.6–33.0)
MCHC: 32.9 g/dL (ref 31.5–35.7)
MCV: 83 fL (ref 79–97)
Monocytes Absolute: 0.6 10*3/uL (ref 0.1–0.9)
Monocytes: 8 %
Neutrophils Absolute: 4 10*3/uL (ref 1.4–7.0)
Neutrophils: 53 %
Platelets: 419 10*3/uL (ref 150–450)
RBC: 4.99 x10E6/uL (ref 4.14–5.80)
RDW: 14.2 % (ref 11.6–15.4)
WBC: 7.6 10*3/uL (ref 3.4–10.8)

## 2019-08-19 LAB — COMPREHENSIVE METABOLIC PANEL
ALT: 18 IU/L (ref 0–44)
AST: 16 IU/L (ref 0–40)
Albumin/Globulin Ratio: 1.5 (ref 1.2–2.2)
Albumin: 4.3 g/dL (ref 4.0–5.0)
Alkaline Phosphatase: 107 IU/L (ref 39–117)
BUN/Creatinine Ratio: 15 (ref 9–20)
BUN: 13 mg/dL (ref 6–24)
Bilirubin Total: 0.2 mg/dL (ref 0.0–1.2)
CO2: 26 mmol/L (ref 20–29)
Calcium: 9.3 mg/dL (ref 8.7–10.2)
Chloride: 103 mmol/L (ref 96–106)
Creatinine, Ser: 0.88 mg/dL (ref 0.76–1.27)
GFR calc Af Amer: 117 mL/min/{1.73_m2} (ref >59)
GFR calc non Af Amer: 101 mL/min/{1.73_m2} (ref >59)
Globulin, Total: 2.8 g/dL (ref 1.5–4.5)
Glucose: 91 mg/dL (ref 65–99)
Potassium: 4 mmol/L (ref 3.5–5.2)
Sodium: 139 mmol/L (ref 134–144)
Total Protein: 7.1 g/dL (ref 6.0–8.5)

## 2019-08-19 LAB — LIPID PANEL
Chol/HDL Ratio: 4.9 ratio (ref 0.0–5.0)
Cholesterol, Total: 209 mg/dL — ABNORMAL HIGH (ref 100–199)
HDL: 43 mg/dL (ref >39)
LDL Chol Calc (NIH): 135 mg/dL — ABNORMAL HIGH (ref 0–99)
Triglycerides: 172 mg/dL — ABNORMAL HIGH (ref 0–149)
VLDL Cholesterol Cal: 31 mg/dL (ref 5–40)

## 2019-08-19 LAB — HEMOGLOBIN A1C
Est. average glucose Bld gHb Est-mCnc: 120 mg/dL
Hgb A1c MFr Bld: 5.8 % — ABNORMAL HIGH (ref 4.8–5.6)

## 2019-08-22 ENCOUNTER — Encounter: Payer: Self-pay | Admitting: Registered Nurse

## 2019-10-25 ENCOUNTER — Other Ambulatory Visit: Payer: Self-pay | Admitting: Registered Nurse

## 2019-10-25 NOTE — Telephone Encounter (Signed)
Pt states he does not take the plain lisinopril. This was sent in to pharmacy. Pt states he takes lisinopril-hydrochlorothiazide (PRINZIDE,ZESTORETIC) 20-25 MG per tablet  Richard has never written for this pt.  Needs new Rx sent to   [id number] [place], [place] (Phone) [phone] (Fax)

## 2019-10-30 ENCOUNTER — Other Ambulatory Visit: Payer: Self-pay | Admitting: Registered Nurse

## 2019-10-30 DIAGNOSIS — I10 Essential (primary) hypertension: Secondary | ICD-10-CM

## 2019-10-30 MED ORDER — LISINOPRIL-HYDROCHLOROTHIAZIDE 20-25 MG PO TABS: 1.0000 | ORAL_TABLET | Freq: Every day | ORAL | 3 refills | Status: AC

## 2019-10-30 NOTE — Telephone Encounter (Signed)
Pt stated that he is taking lisinopril-hydrochlorothiazide 20-25mg , not just the regular Lisinoprol. Is this what he is supposed to be taking? Please Advise on this. Pk/CMA

## 2020-02-15 ENCOUNTER — Ambulatory Visit: Payer: BC Managed Care – PPO | Admitting: Family Medicine

## 2020-02-19 ENCOUNTER — Encounter: Payer: Self-pay | Admitting: Family Medicine

## 2020-04-23 ENCOUNTER — Telehealth: Payer: Self-pay | Admitting: Adult Health

## 2020-04-23 NOTE — Telephone Encounter (Signed)
Called and talked with patient about his covid + that he was diagnosed with.  He was tested at [facility] [address].  I reviewed with him the potential benefit of receiving monoclonal antibody therapy for his COVID19 infection to reduce the risk in hospitalization and/or complications.  He meets criteria based on BMI and HTN, however his symptoms began 10 days ago, so he is outside of the infusion window.  I advised him on supportive care measures and gave him CB number for the Post Covid care clinic on [address].  Ryan Bihari, NP

## 2020-06-28 ENCOUNTER — Other Ambulatory Visit: Payer: Self-pay

## 2020-06-28 ENCOUNTER — Ambulatory Visit
Admission: RE | Admit: 2020-06-28 | Discharge: 2020-06-28 | Disposition: A | Discharge status: Home / Self Care | Payer: Medicaid Other | Source: Ambulatory Visit | Attending: Family Medicine | Admitting: Family Medicine

## 2020-06-28 ENCOUNTER — Other Ambulatory Visit: Payer: Self-pay | Admitting: Family Medicine

## 2020-06-28 DIAGNOSIS — M79672 Pain in left foot: Secondary | ICD-10-CM

## 2020-07-30 ENCOUNTER — Ambulatory Visit (INDEPENDENT_AMBULATORY_CARE_PROVIDER_SITE_OTHER): Payer: BC Managed Care – PPO

## 2020-07-30 ENCOUNTER — Ambulatory Visit: Payer: Medicaid Other | Admitting: Podiatry

## 2020-07-30 ENCOUNTER — Other Ambulatory Visit: Payer: Self-pay

## 2020-07-30 ENCOUNTER — Encounter: Payer: Self-pay | Admitting: Podiatry

## 2020-07-30 DIAGNOSIS — Q66222 Congenital metatarsus adductus, left foot: Secondary | ICD-10-CM

## 2020-07-30 DIAGNOSIS — Q6652 Congenital pes planus, left foot: Secondary | ICD-10-CM | POA: Diagnosis not present

## 2020-07-30 DIAGNOSIS — M25572 Pain in left ankle and joints of left foot: Secondary | ICD-10-CM

## 2020-07-30 DIAGNOSIS — M79672 Pain in left foot: Secondary | ICD-10-CM

## 2020-07-30 NOTE — Progress Notes (Signed)
  Subjective:  Patient ID: Ryan Snyder, male    DOB: 05-Nov-1971,  MRN: [id number]  Chief Complaint  Patient presents with  . Foot Pain    Left foot generalized pain, 2-3 month duration, no known injuries, pain most prominent in the plantar midfoot and lateral aspect. Pt states "I think my foot curves"    49 y.o. male presents with the above complaint. History confirmed with patient.   Objective:  Physical Exam: warm, good capillary refill, no trophic changes or ulcerative lesions, normal DP and PT pulses and normal sensory exam. Left Foot: C shaped foot, POP lateral column and medial arch, maximal tenderness at the sinus tarsi    No images are attached to the encounter.  Radiographs: X-ray of the left foot: no fracture, dislocation, swelling or degenerative changes noted, met adductus noted. Assessment:   1. 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 Metatarsus adductus of left foot    Plan:  Patient was evaluated and treated and all questions answered.  Sinus Tarsitis -X-rays reviewed as above -Dispensed Tri-Lock ankle brace.  Patient educated on use -Injection delivered to the sinus tarsi  Procedure: Injection Intermediate Joint Consent: Verbal consent obtained. Location: Left sinus tarsi. Skin Prep: alcohol. Injectate: 1 cc 0.5% marcaine plain, 1 cc celestone Disposition: Patient tolerated procedure well. Injection site dressed with a band-aid.  Return in about 6 weeks (around 09/10/2020) for sinus tarsitis.

## 2020-09-13 ENCOUNTER — Ambulatory Visit: Payer: Medicaid Other | Admitting: Podiatry

## 2020-10-03 ENCOUNTER — Other Ambulatory Visit: Payer: Self-pay | Admitting: Podiatry

## 2020-10-03 DIAGNOSIS — M25572 Pain in left ankle and joints of left foot: Secondary | ICD-10-CM

## 2021-04-17 IMAGING — CR DG FOOT COMPLETE 3+V*L*
3 series · 3 of 3 positions shown · non-contrast
Comparison: None.

CLINICAL DATA: Left lateral and plantar foot pain x 4 months, with
NKI, pain worse with weight bearing

EXAM:
LEFT FOOT - COMPLETE 3+ VIEW

[x foot ap left]
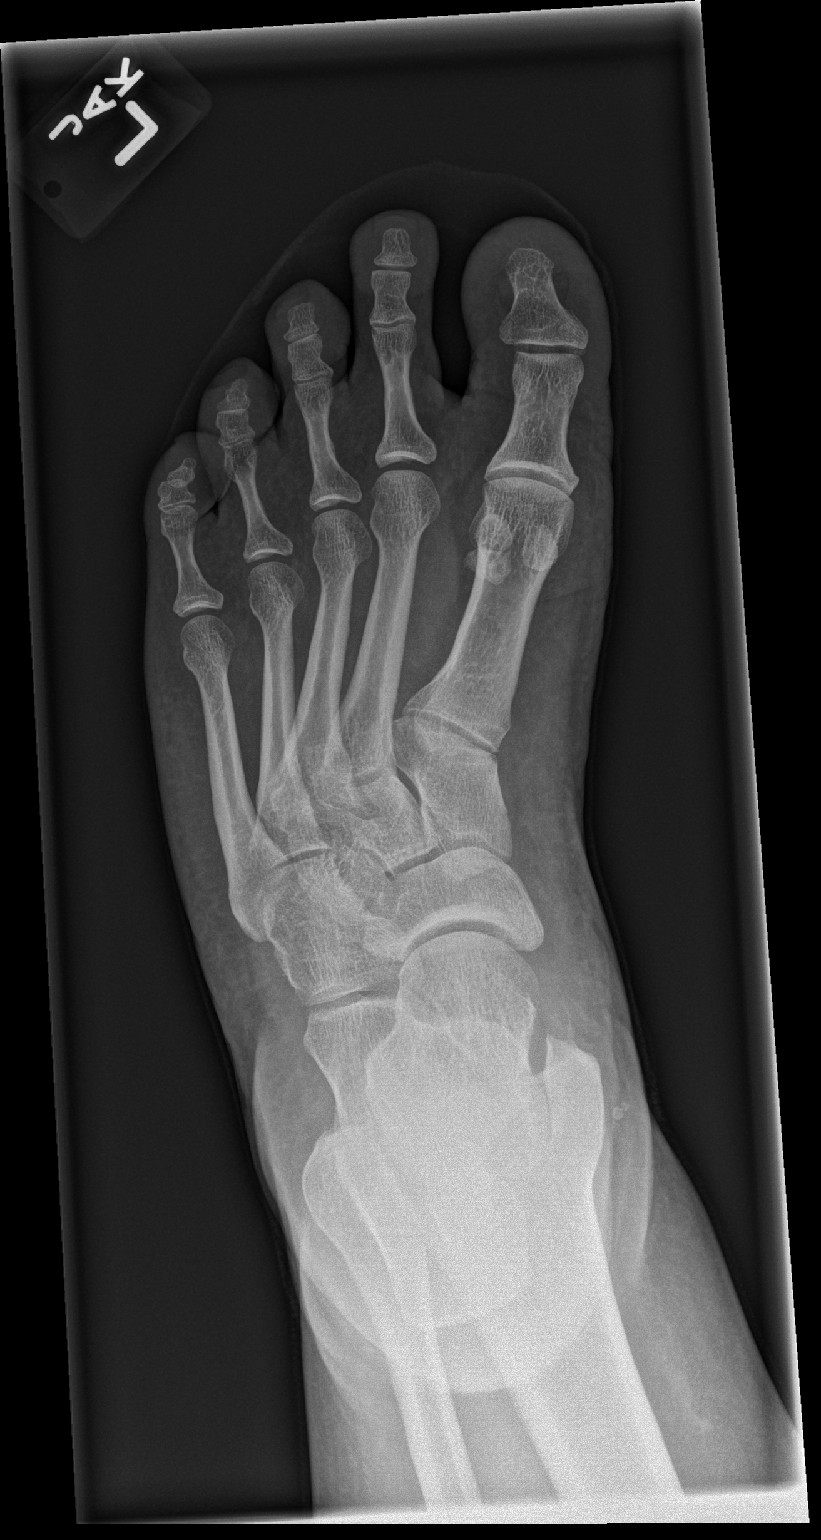

[x foot obl left]
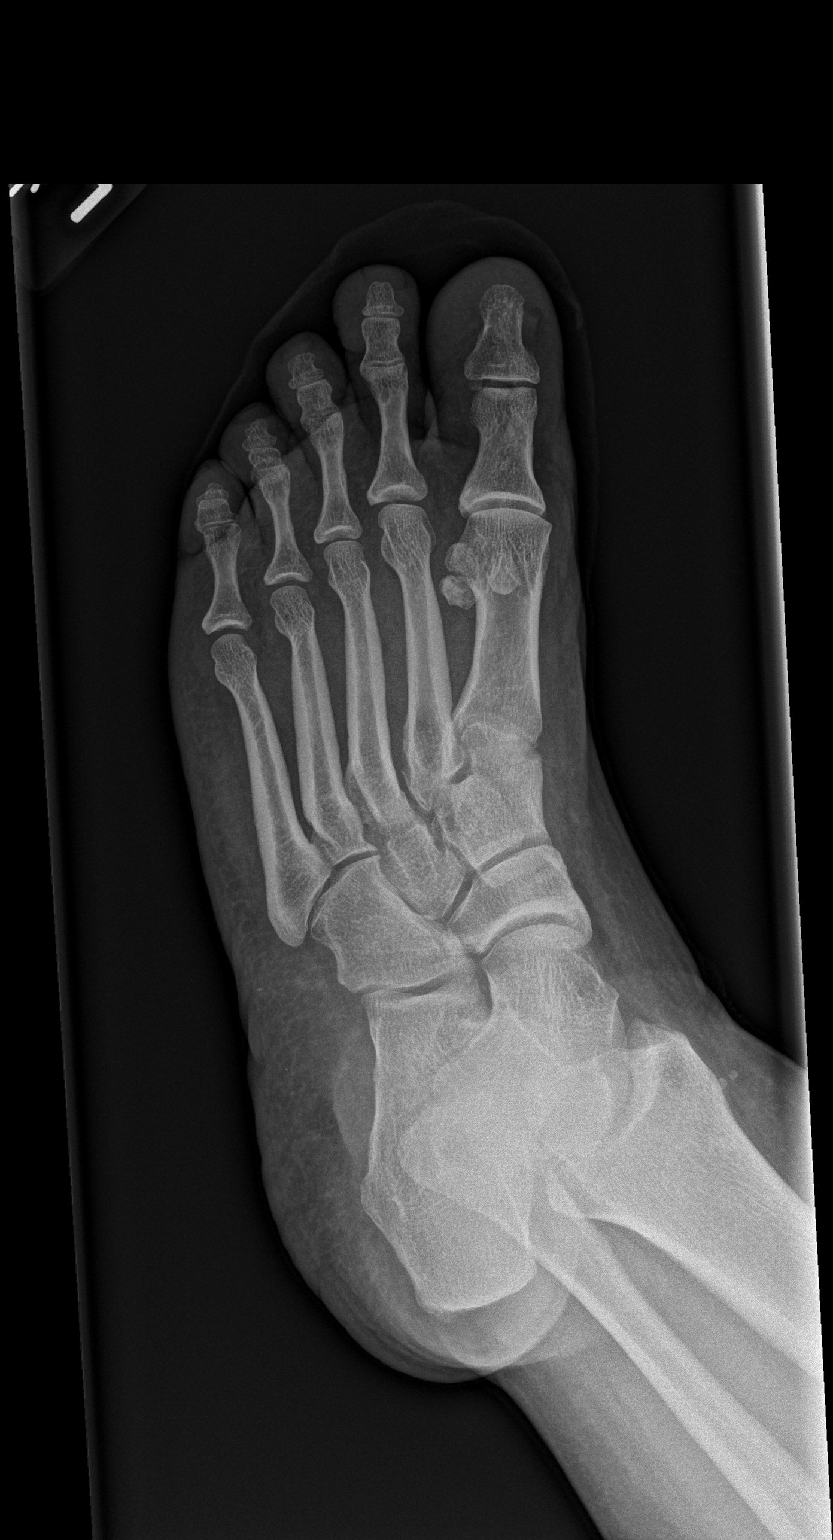

[x foot lat left]
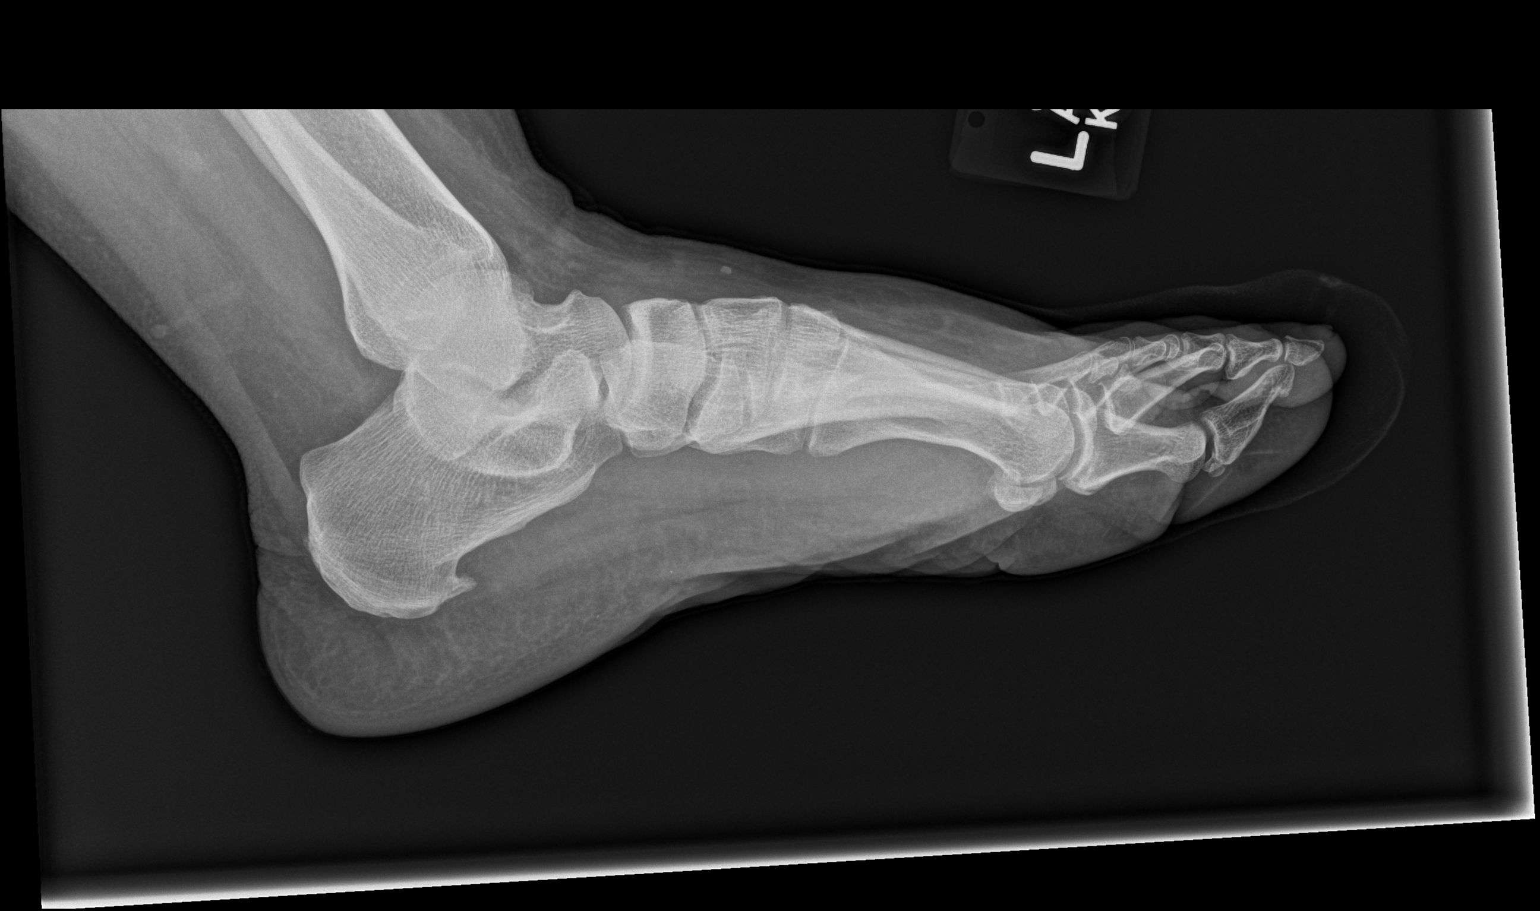

[3 of 3 positions shown; findings below may reference images not displayed]

FINDINGS: There is no evidence of fracture or dislocation. There is no
evidence of arthropathy or other focal bone abnormality. Soft
tissues are unremarkable.
IMPRESSION: Negative radiographs of the left foot.

## 2022-06-02 ENCOUNTER — Other Ambulatory Visit: Payer: Self-pay | Admitting: Family Medicine

## 2022-06-02 ENCOUNTER — Ambulatory Visit
Admission: RE | Admit: 2022-06-02 | Discharge: 2022-06-02 | Disposition: A | Discharge status: Home / Self Care | Payer: Medicaid Other | Source: Ambulatory Visit | Attending: Family Medicine | Admitting: Family Medicine

## 2022-06-02 DIAGNOSIS — M25561 Pain in right knee: Secondary | ICD-10-CM

## 2022-09-10 ENCOUNTER — Encounter: Payer: BC Managed Care – PPO | Attending: Family Medicine | Admitting: Dietician

## 2022-09-10 ENCOUNTER — Encounter: Payer: Self-pay | Admitting: Dietician

## 2022-09-10 DIAGNOSIS — E669 Obesity, unspecified: Secondary | ICD-10-CM | POA: Diagnosis not present

## 2022-09-10 DIAGNOSIS — E782 Mixed hyperlipidemia: Secondary | ICD-10-CM | POA: Diagnosis not present

## 2022-09-10 DIAGNOSIS — I10 Essential (primary) hypertension: Secondary | ICD-10-CM | POA: Insufficient documentation

## 2022-09-10 DIAGNOSIS — Z713 Dietary counseling and surveillance: Secondary | ICD-10-CM | POA: Diagnosis present

## 2022-09-10 DIAGNOSIS — E785 Hyperlipidemia, unspecified: Secondary | ICD-10-CM | POA: Diagnosis not present

## 2022-09-10 NOTE — Patient Instructions (Addendum)
Aim for 150 minutes of physical activity weekly. -Restart your gym membership: Go 2 times per week.  -Walk on lunch break  Eat more Non-Starchy Vegetables and Fruits  Minimize added sugars and refined grains.  Choose whole foods over processed.  Make simple meals at home more often than eating out.  Aim to eat within 1-2 hours of waking up and every 3-5 hours following.

## 2022-09-10 NOTE — Progress Notes (Signed)
Medical Nutrition Therapy  Appointment Start time:  [phone]  Appointment End time:  [phone]  Primary concerns today: Pt states he wants to lose weight and learn how to eat healthy and wants to know how to start. Pt states he sometimes has acid reflux.   Referral diagnosis: obesity Preferred learning style: no preference indicated Learning readiness: ready   NUTRITION ASSESSMENT   Anthropometrics  Ht: 66in Wt: 263lbs  Clinical Medical Hx: GERD, HTN.  Medications: reviewed Labs: 06/02/22: cholesterol 235, HDL 43, LDL 165.  Notable Signs/Symptoms: none Food Allergies: none  Lifestyle & Dietary Hx  Pt works in [employer] but mostly sits. Pt works from 7-5:30pm, 4x/wk, and gets 30 minute lunch break. Pt thinks he could walk on his lunch break.    Pt states right now he only eats breakfast and dinner and then just has 1 snack at work because he thinks daytime fasting is good for weight loss and states he feels good during Ramadan which is why he started this way of eating. Discussed including a larger, balanced snack midday as opposed to a small snack or skipping.   Pt states that being [place] he eats a lot of bread and feels that this is an issue. Pt usually has 1 disk of Syrian style bread with dinner.  Pt eats 7 dates at breakfast daily in traditional Islam practice. Pt has to eat an odd number of dates so mentioned having 3 instead of 7.   Pt is currently not exercising but mentioned he wants to get a gym membership and go on his 3 consecutive days off.   Pt's wife cooks dinner. Pt lives with wife and 3 kids. Pt reports they try not to keep many processed and high sugar foods in the house and do not allow their kids to have these.   Estimated daily fluid intake: 48-60 oz Supplements: none Sleep: 7 hours Stress / self-care: mild stress Current average weekly physical activity: ADLs  24-Hr Dietary Recall First Meal: egg on english muffin, 7 medjool dates,  Snack: tea  Second  Meal: trail mix OR dates Snack: trail mix OR dates Third Meal: chicken with pita bread and salad with olives and feta Snack: none Beverages: tea with milk, tea with stevia, water   NUTRITION DIAGNOSIS  NB-1.1 Food and nutrition-related knowledge deficit As related to lack of prior nutrition education.  As evidenced by pt report and diet history.   NUTRITION INTERVENTION  Nutrition education (E-1) on the following topics:  Strategies to reduce saturated fat Building balanced snacks Importance of eating consistently Hydration status Balance of carbohydrate, protein, fruits and non-starchy vegetables Physical activity goals Functions of fiber Benefits of physical activity  Handouts Provided Include  Dish Up a Healthy Meal Nutrition Care Manual: LDL-lowering nutrition therapy  Learning Style & Readiness for Change Teaching method utilized: Visual & Auditory  Demonstrated degree of understanding via: Teach Back  Barriers to learning/adherence to lifestyle change: none  Goals Established by Pt Aim for 150 minutes of physical activity weekly. -Restart your gym membership: Go 2 times per week.  -Walk on lunch break  Eat more Non-Starchy Vegetables and Fruits  Minimize added sugars and refined grains.  Choose whole foods over processed.  Make simple meals at home more often than eating out.  Aim to eat within 1-2 hours of waking up and every 3-5 hours following.    MONITORING & EVALUATION Dietary intake, weekly physical activity, and follow up in 10 weeks.  Next Steps  Patient is to call for questions.

## 2022-10-22 ENCOUNTER — Encounter (HOSPITAL_COMMUNITY): Payer: Self-pay

## 2022-10-22 ENCOUNTER — Other Ambulatory Visit: Payer: Self-pay

## 2022-10-22 ENCOUNTER — Emergency Department (HOSPITAL_COMMUNITY)
Admission: EM | Admit: 2022-10-22 | Discharge: 2022-10-22 | Discharge status: Against Medical Advice | Payer: BC Managed Care – PPO | Attending: Physician Assistant | Admitting: Physician Assistant

## 2022-10-22 DIAGNOSIS — R030 Elevated blood-pressure reading, without diagnosis of hypertension: Secondary | ICD-10-CM | POA: Diagnosis not present

## 2022-10-22 DIAGNOSIS — R519 Headache, unspecified: Secondary | ICD-10-CM | POA: Insufficient documentation

## 2022-10-22 DIAGNOSIS — Z5321 Procedure and treatment not carried out due to patient leaving prior to being seen by health care provider: Secondary | ICD-10-CM | POA: Diagnosis not present

## 2022-10-22 LAB — CBC WITH DIFFERENTIAL/PLATELET
Abs Immature Granulocytes: 0.04 10*3/uL (ref 0.00–0.07)
Basophils Absolute: 0.1 10*3/uL (ref 0.0–0.1)
Basophils Relative: 1 %
Eosinophils Absolute: 0.4 10*3/uL (ref 0.0–0.5)
Eosinophils Relative: 4 %
HCT: 43.1 % (ref 39.0–52.0)
Hemoglobin: 13.7 g/dL (ref 13.0–17.0)
Immature Granulocytes: 1 %
Lymphocytes Relative: 34 %
Lymphs Abs: 2.9 10*3/uL (ref 0.7–4.0)
MCH: 27 pg (ref 26.0–34.0)
MCHC: 31.8 g/dL (ref 30.0–36.0)
MCV: 84.8 fL (ref 80.0–100.0)
Monocytes Absolute: 0.8 10*3/uL (ref 0.1–1.0)
Monocytes Relative: 9 %
Neutro Abs: 4.5 10*3/uL (ref 1.7–7.7)
Neutrophils Relative %: 51 %
Platelets: 373 10*3/uL (ref 150–400)
RBC: 5.08 MIL/uL (ref 4.22–5.81)
RDW: 14.2 % (ref 11.5–15.5)
WBC: 8.7 10*3/uL (ref 4.0–10.5)
nRBC: 0 % (ref 0.0–0.2)

## 2022-10-22 LAB — COMPREHENSIVE METABOLIC PANEL
ALT: 15 U/L (ref 0–44)
AST: 17 U/L (ref 15–41)
Albumin: 3.9 g/dL (ref 3.5–5.0)
Alkaline Phosphatase: 82 U/L (ref 38–126)
Anion gap: 10 (ref 5–15)
BUN: 14 mg/dL (ref 6–20)
CO2: 25 mmol/L (ref 22–32)
Calcium: 9.5 mg/dL (ref 8.9–10.3)
Chloride: 102 mmol/L (ref 98–111)
Creatinine, Ser: 0.83 mg/dL (ref 0.61–1.24)
GFR, Estimated: >60 mL/min (ref >60)
Glucose, Bld: 99 mg/dL (ref 70–99)
Potassium: 3.6 mmol/L (ref 3.5–5.1)
Sodium: 137 mmol/L (ref 135–145)
Total Bilirubin: 0.6 mg/dL (ref 0.3–1.2)
Total Protein: 8.1 g/dL (ref 6.5–8.1)

## 2022-10-22 NOTE — ED Triage Notes (Signed)
C/o headache, fatigue, left neck pain x3 hrs.  Reports lifting tv 2 hr prior to headache Hx htn with no missed doses of meds.  Reports bp 180/100 at home

## 2022-10-22 NOTE — ED Provider Triage Note (Signed)
Emergency Medicine Provider Triage Evaluation Note  Ryan Snyder , a 51 y.o. male  was evaluated in triage.  Pt complains of a headache and high blood pressure.  Pt went to urgent care and was told to come to Ed for elevated bp.  Pt reports he had a headache.  Pt reports he took tylenol and headache is better.  Review of Systems  Positive: Headache. Neck soreness Negative: fever  Physical Exam  BP (!) 185/104   Pulse 66   Temp 98.2 F (36.8 C)   Resp 20   Ht '5\' 6"'$  (1.676 m)   Wt 119 kg   SpO2 98%   BMI 42.34 kg/m  Gen:   Awake, no distress   Resp:  Normal effort  MSK:   Moves extremities without difficulty  Other:    Medical Decision Making  Medically screening exam initiated at 3:30 PM.  Appropriate orders placed.  Ryan Snyder was informed that the remainder of the evaluation will be completed by another provider, this initial triage assessment does not replace that evaluation, and the importance of remaining in the ED until their evaluation is complete.     Fransico Meadow, [place] 10/22/22 2094

## 2022-10-30 ENCOUNTER — Other Ambulatory Visit: Payer: Self-pay | Admitting: Family Medicine

## 2022-10-30 ENCOUNTER — Ambulatory Visit
Admission: RE | Admit: 2022-10-30 | Discharge: 2022-10-30 | Disposition: A | Discharge status: Home / Self Care | Payer: BC Managed Care – PPO | Source: Ambulatory Visit | Attending: Family Medicine | Admitting: Family Medicine

## 2022-10-30 DIAGNOSIS — M25512 Pain in left shoulder: Secondary | ICD-10-CM

## 2022-10-30 DIAGNOSIS — M542 Cervicalgia: Secondary | ICD-10-CM

## 2022-10-30 DIAGNOSIS — S1080XD Unspecified superficial injury of other specified part of neck, subsequent encounter: Secondary | ICD-10-CM

## 2022-11-20 ENCOUNTER — Ambulatory Visit: Payer: Medicaid Other | Admitting: Dietician
